# Patient Record
Sex: Female | Born: 1985
Health system: Southern US, Community
[De-identification: ages and names within clinical notes are randomized; demographics above are authoritative.]

## PROBLEM LIST (undated history)

## (undated) DIAGNOSIS — F32A Depression, unspecified: Secondary | ICD-10-CM

## (undated) DIAGNOSIS — F419 Anxiety disorder, unspecified: Secondary | ICD-10-CM

## (undated) DIAGNOSIS — K219 Gastro-esophageal reflux disease without esophagitis: Secondary | ICD-10-CM

## (undated) DIAGNOSIS — F329 Major depressive disorder, single episode, unspecified: Secondary | ICD-10-CM

## (undated) HISTORY — DX: Major depressive disorder, single episode, unspecified: F32.9

## (undated) HISTORY — DX: Anxiety disorder, unspecified: F41.9

## (undated) HISTORY — DX: Depression, unspecified: F32.A

## (undated) HISTORY — DX: Gastro-esophageal reflux disease without esophagitis: K21.9

---
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
## 2007-04-09 ENCOUNTER — Emergency Department (HOSPITAL_COMMUNITY): Admission: EM | Admit: 2007-04-09 | Discharge: 2007-04-09 | Payer: Self-pay | Admitting: Emergency Medicine

## 2007-04-10 ENCOUNTER — Emergency Department (HOSPITAL_COMMUNITY): Admission: EM | Admit: 2007-04-10 | Discharge: 2007-04-10 | Payer: Self-pay | Admitting: *Deleted

## 2007-07-29 ENCOUNTER — Emergency Department (HOSPITAL_COMMUNITY): Admission: EM | Admit: 2007-07-29 | Discharge: 2007-07-29 | Payer: Self-pay | Admitting: Emergency Medicine

## 2007-08-16 ENCOUNTER — Emergency Department (HOSPITAL_COMMUNITY): Admission: EM | Admit: 2007-08-16 | Discharge: 2007-08-16 | Payer: Self-pay | Admitting: Emergency Medicine

## 2010-07-12 ENCOUNTER — Ambulatory Visit (HOSPITAL_COMMUNITY)
Admission: RE | Admit: 2010-07-12 | Discharge: 2010-07-12 | Disposition: A | Discharge status: Home / Self Care | Payer: Worker's Compensation | Source: Ambulatory Visit | Attending: Specialist | Admitting: Specialist

## 2010-07-12 ENCOUNTER — Other Ambulatory Visit (HOSPITAL_COMMUNITY): Payer: Self-pay | Admitting: Specialist

## 2010-07-12 ENCOUNTER — Ambulatory Visit (HOSPITAL_COMMUNITY): Payer: Worker's Compensation | Attending: *Deleted

## 2010-07-12 DIAGNOSIS — M5126 Other intervertebral disc displacement, lumbar region: Secondary | ICD-10-CM | POA: Insufficient documentation

## 2010-07-12 DIAGNOSIS — Z01818 Encounter for other preprocedural examination: Secondary | ICD-10-CM | POA: Insufficient documentation

## 2010-07-12 LAB — URINALYSIS, ROUTINE W REFLEX MICROSCOPIC
Bilirubin Urine: NEGATIVE
Ketones, ur: NEGATIVE mg/dL
Protein, ur: NEGATIVE mg/dL
Urine Glucose, Fasting: NEGATIVE mg/dL
pH: 6 (ref 5.0–8.0)

## 2010-07-12 LAB — COMPREHENSIVE METABOLIC PANEL
Albumin: 3.9 g/dL (ref 3.5–5.2)
BUN: 6 mg/dL (ref 6–23)
CO2: 23 mEq/L (ref 19–32)
Calcium: 9.4 mg/dL (ref 8.4–10.5)
Chloride: 106 mEq/L (ref 96–112)
Creatinine, Ser: 0.81 mg/dL (ref 0.4–1.2)
GFR calc non Af Amer: >60 mL/min (ref >60)
Total Bilirubin: 0.6 mg/dL (ref 0.3–1.2)

## 2010-07-12 LAB — CBC
Hemoglobin: 14.1 g/dL (ref 12.0–15.0)
MCHC: 35.3 g/dL (ref 30.0–36.0)
RBC: 4.32 MIL/uL (ref 3.87–5.11)
WBC: 10.1 10*3/uL (ref 4.0–10.5)

## 2010-07-12 LAB — URINE MICROSCOPIC-ADD ON

## 2010-07-12 LAB — APTT: aPTT: 29 seconds (ref 24–37)

## 2010-07-12 LAB — SURGICAL PCR SCREEN
MRSA, PCR: NEGATIVE
Staphylococcus aureus: NEGATIVE

## 2010-07-12 LAB — PROTIME-INR: Prothrombin Time: 13.5 seconds (ref 11.6–15.2)

## 2010-07-17 ENCOUNTER — Ambulatory Visit (HOSPITAL_COMMUNITY): Payer: Worker's Compensation

## 2010-07-17 ENCOUNTER — Ambulatory Visit (HOSPITAL_COMMUNITY)
Admission: RE | Admit: 2010-07-17 | Discharge: 2010-07-18 | Disposition: A | Discharge status: Home / Self Care | Payer: Worker's Compensation | Attending: Specialist | Admitting: Specialist

## 2010-07-17 DIAGNOSIS — M5126 Other intervertebral disc displacement, lumbar region: Secondary | ICD-10-CM | POA: Insufficient documentation

## 2010-07-17 DIAGNOSIS — IMO0002 Reserved for concepts with insufficient information to code with codable children: Secondary | ICD-10-CM | POA: Insufficient documentation

## 2010-07-17 DIAGNOSIS — F17200 Nicotine dependence, unspecified, uncomplicated: Secondary | ICD-10-CM | POA: Insufficient documentation

## 2010-07-23 NOTE — Op Note (Signed)
Mary Nielsen, Mary Nielsen                ACCOUNT NO.:  [id number]  MEDICAL RECORD NO.:  [id number]           PATIENT TYPE:  O  LOCATION:  XRAY                         FACILITY:  [facility]  PHYSICIAN:  Jene Every, M.D.    DATE OF BIRTH:  June 03, 1986  DATE OF PROCEDURE:  07/17/2010 DATE OF DISCHARGE:  07/12/2010                              OPERATIVE REPORT   PREOPERATIVE DIAGNOSES: 1. Spinal stenosis. 2. Herniated nucleus pulposus L5-S1, left.  POSTOPERATIVE DIAGNOSES: 1. Spinal stenosis. 2. Herniated nucleus pulposus L5-S1, left.  PROCEDURES PERFORMED: 1. Lateral recess decompression L5-S1. 2. Microdiskectomy L5-S1, left. 3. Foraminotomy of S1, left.  ANESTHESIA:  General.  ASSISTANT:  Roma Schanz, P.A.  BRIEF HISTORY:  A 25 year old with left lower extremity radicular pain, positive neural tension sign, diminished sensation in the L5-S1 dermatome, EHL weakness and plantar flexion weakness secondary to disk herniation sustained at work with compression of the S1 nerve root, disk generation, disk space narrowing.  She is indicated for decompression of the S1 nerve root.  Risks and benefits were discussed including bleeding, infection, damage to neurovascular structures, CSF leakage, epidural fibrosis, adjacent segment disease, need for fusion in the future, DVT, PE, and anesthetic complications, etc.  Technical difficulty increased due to the patient's obesity.  Her BMI was approximately 32.  TECHNIQUE:  The patient in supine position after induction of adequate anesthesia and 2 g Kefzol.  She was placed prone on the [place] frame. All bony prominences were well padded.  Lumbar region was prepped and draped in usual sterile fashion.  An 18-gauge spinal needle was utilized to localize the L5-S1 interspace, confirmed with x-ray.  Incision was made from the spinous process to L5-S1.  Subcutaneous tissue was dissected.  Electrocautery utilized to achieve hemostasis.   Dorsolumbar fascia identified, divided in line with skin incision.  Paraspinous muscle elevated from lamina of L5 and S1.  McCullough retractor was placed.  She had significant subcutaneous adipose tissue.  Operating microscope was draped and brought in the surgical field after we obtained an x-ray confirming the space of L5-S1.  The straight curette was utilized to detach ligamentum flavum from the cephalad edge of S1. Hemilaminotomy of the caudad edge of 5 was performed with 2-mm Kerrison preserving the pars, ligamentum flavum removed from the interspace. Noted was compression of the S1 nerve root into lateral recess due to a focal HNP at L5-S1.  After foraminotomy of S1 was performed, we gently mobilized the S1 nerve root medially, identified the disk herniation and performed annulotomy, removed the disk herniation with a micro pituitary and a straight pituitary.  Multiple free fragments were removed. Following this, we entered the disk space with straight and upbiting pituitary, performed a thorough diskectomy of all herniated material. Degenerative disk material was appreciated.  There were also osteophytes over the posterior aspect of the vertebral body of L5 and S1.  Following the full diskectomy, we checked beneath the thecal sac, the axilla of the root, the foramen of L5 and S1.  No residual disk herniation compressing the L5 or the S1 nerve root.  There was at least 1 cm of  excursion on the S1 nerve root medial to the pedicle without difficulty. Disk space copiously irrigated with antibiotic irrigation.  The S1 nerve root was found to be erythematous and edematous, but now without tension.  Bipolar cautery had been utilized to achieve hemostasis.  I copiously irrigated the disk space with antibiotic irrigation after obtaining a confirmatory radiograph.  Next, epidural fat was draped over the S1 nerve root.  Removed the [facility] retractors, copiously irrigated, no evidence of  active bleeding.  Dorsolumbar fascia reapproximated with 1 Vicryl interrupted figure-of-8 sutures, subcutaneous with 2-0 Vicryl simple sutures, and skin was reapproximated with 4-0 subcuticular Prolene.  Wound reinforced with Steri-Strips. Sterile dressing applied.  Placed supine on the hospital bed, extubated without difficulty, and transported to recovery room in satisfactory condition.  The patient tolerated procedure well.  No complications.  BLOOD LOSS:  Minimal.     Jene Every, M.D.     Cordelia Pen  D:  07/17/2010  T:  07/17/2010  Job:  [id number]  Electronically Signed by Jene Every M.D. on 07/23/2010 12:16:58 PM

## 2011-03-03 LAB — CBC
Hemoglobin: 13.5
RBC: 4.34

## 2011-03-03 LAB — WET PREP, GENITAL
Trich, Wet Prep: NONE SEEN
Yeast Wet Prep HPF POC: NONE SEEN

## 2011-03-03 LAB — GC/CHLAMYDIA PROBE AMP, GENITAL: GC Probe Amp, Genital: NEGATIVE

## 2011-03-03 LAB — ABO/RH: ABO/RH(D): A POS

## 2011-03-19 LAB — DIFFERENTIAL
Basophils Absolute: 0.2 — ABNORMAL HIGH
Lymphocytes Relative: 34
Monocytes Absolute: 0.4
Monocytes Relative: 5
Neutro Abs: 3.8

## 2011-03-19 LAB — CBC
HCT: 38.6
MCHC: 34.4
MCV: 91.3
Platelets: 340
RDW: 11.8

## 2011-03-19 LAB — COMPREHENSIVE METABOLIC PANEL
Albumin: 3.5
BUN: 9
Creatinine, Ser: 0.69
Total Bilirubin: 0.4
Total Protein: 6.7

## 2011-06-10 HISTORY — PX: MICRODISCECTOMY LUMBAR: SUR864

## 2011-07-04 ENCOUNTER — Emergency Department: Payer: Self-pay | Admitting: Emergency Medicine

## 2011-09-19 IMAGING — CR DG CHEST 2V
2 series · 2 of 2 positions shown · non-contrast
Comparison: None.

CLINICAL DATA: Preop for microdiskectomy.

CHEST - 2 VIEW

[w chest pa]
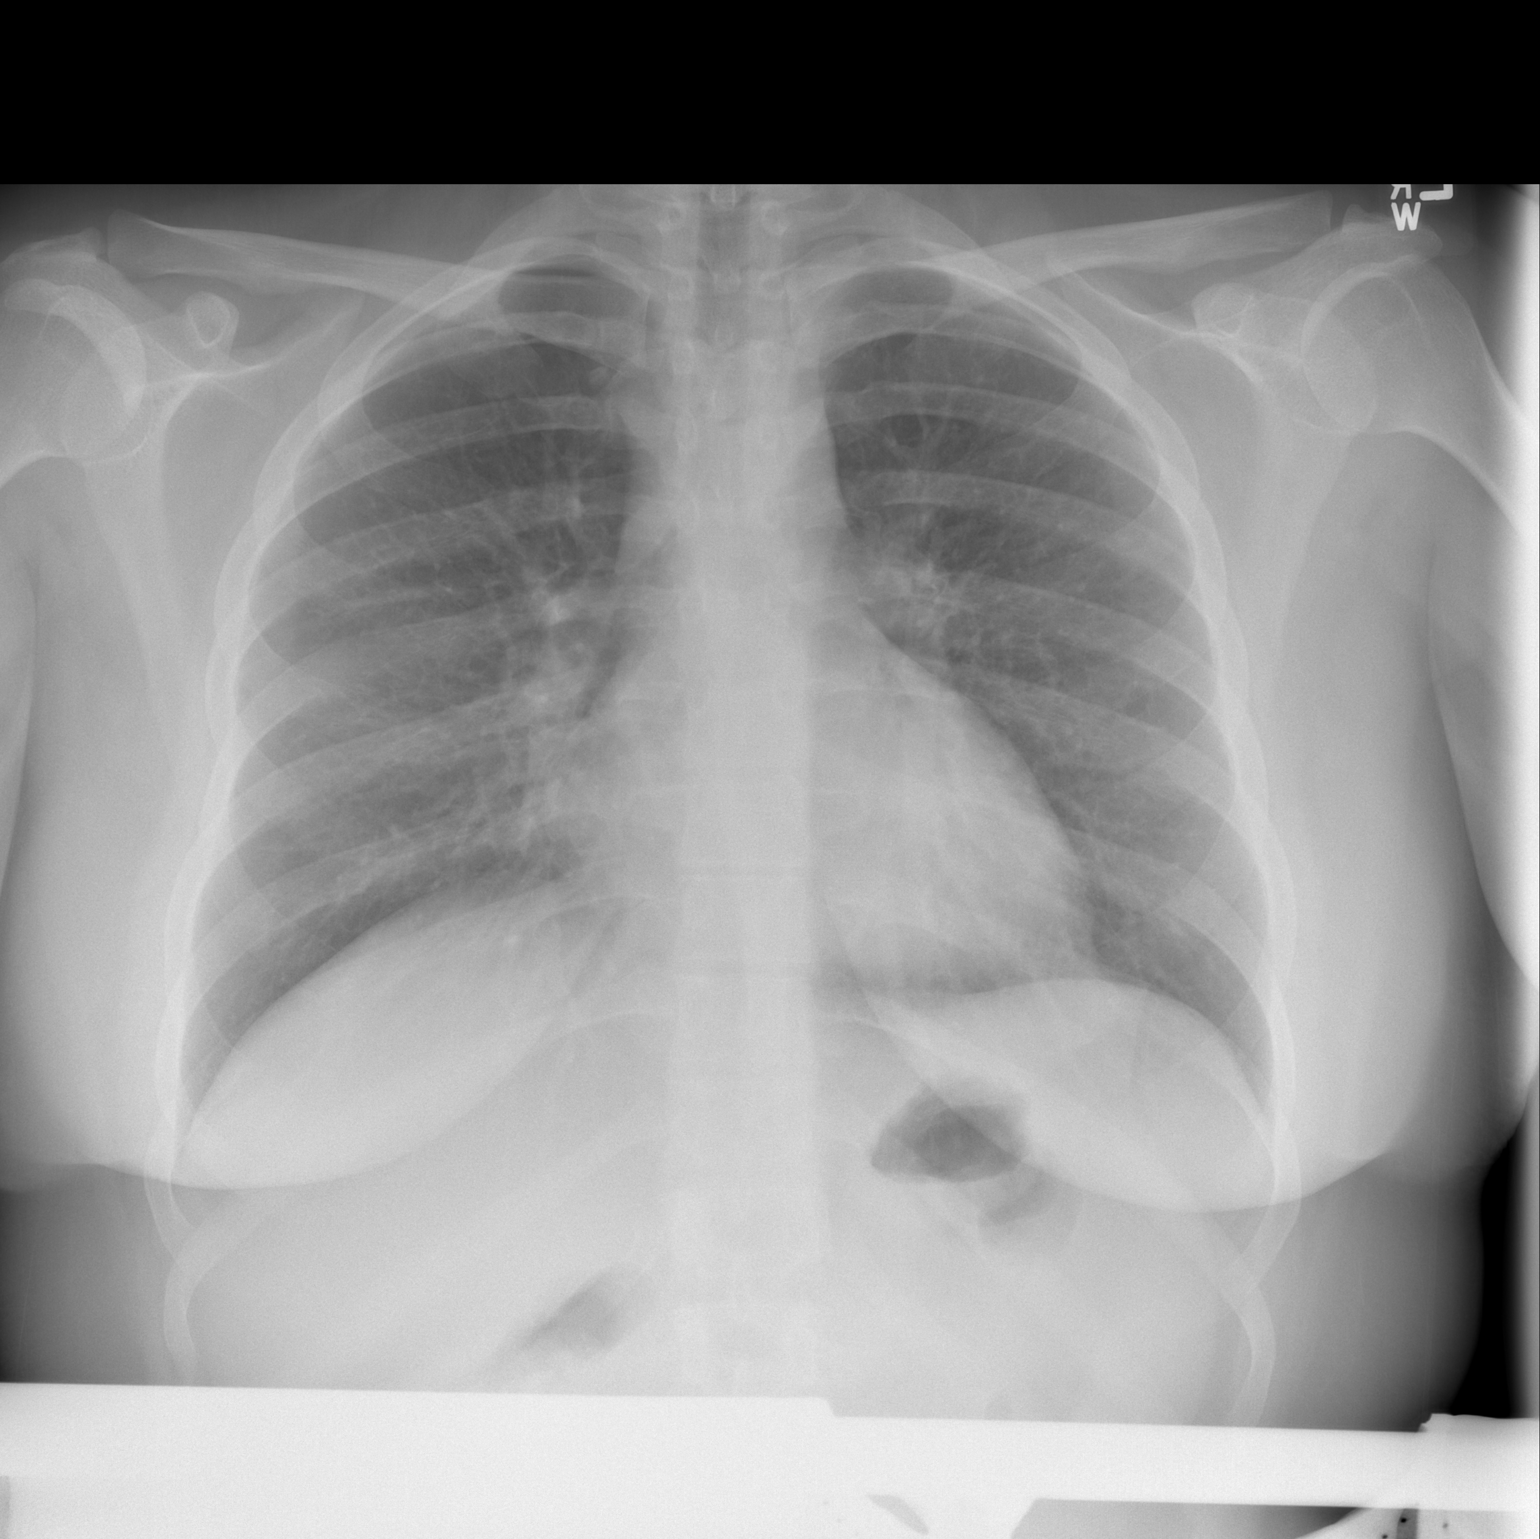

[w chest lat]
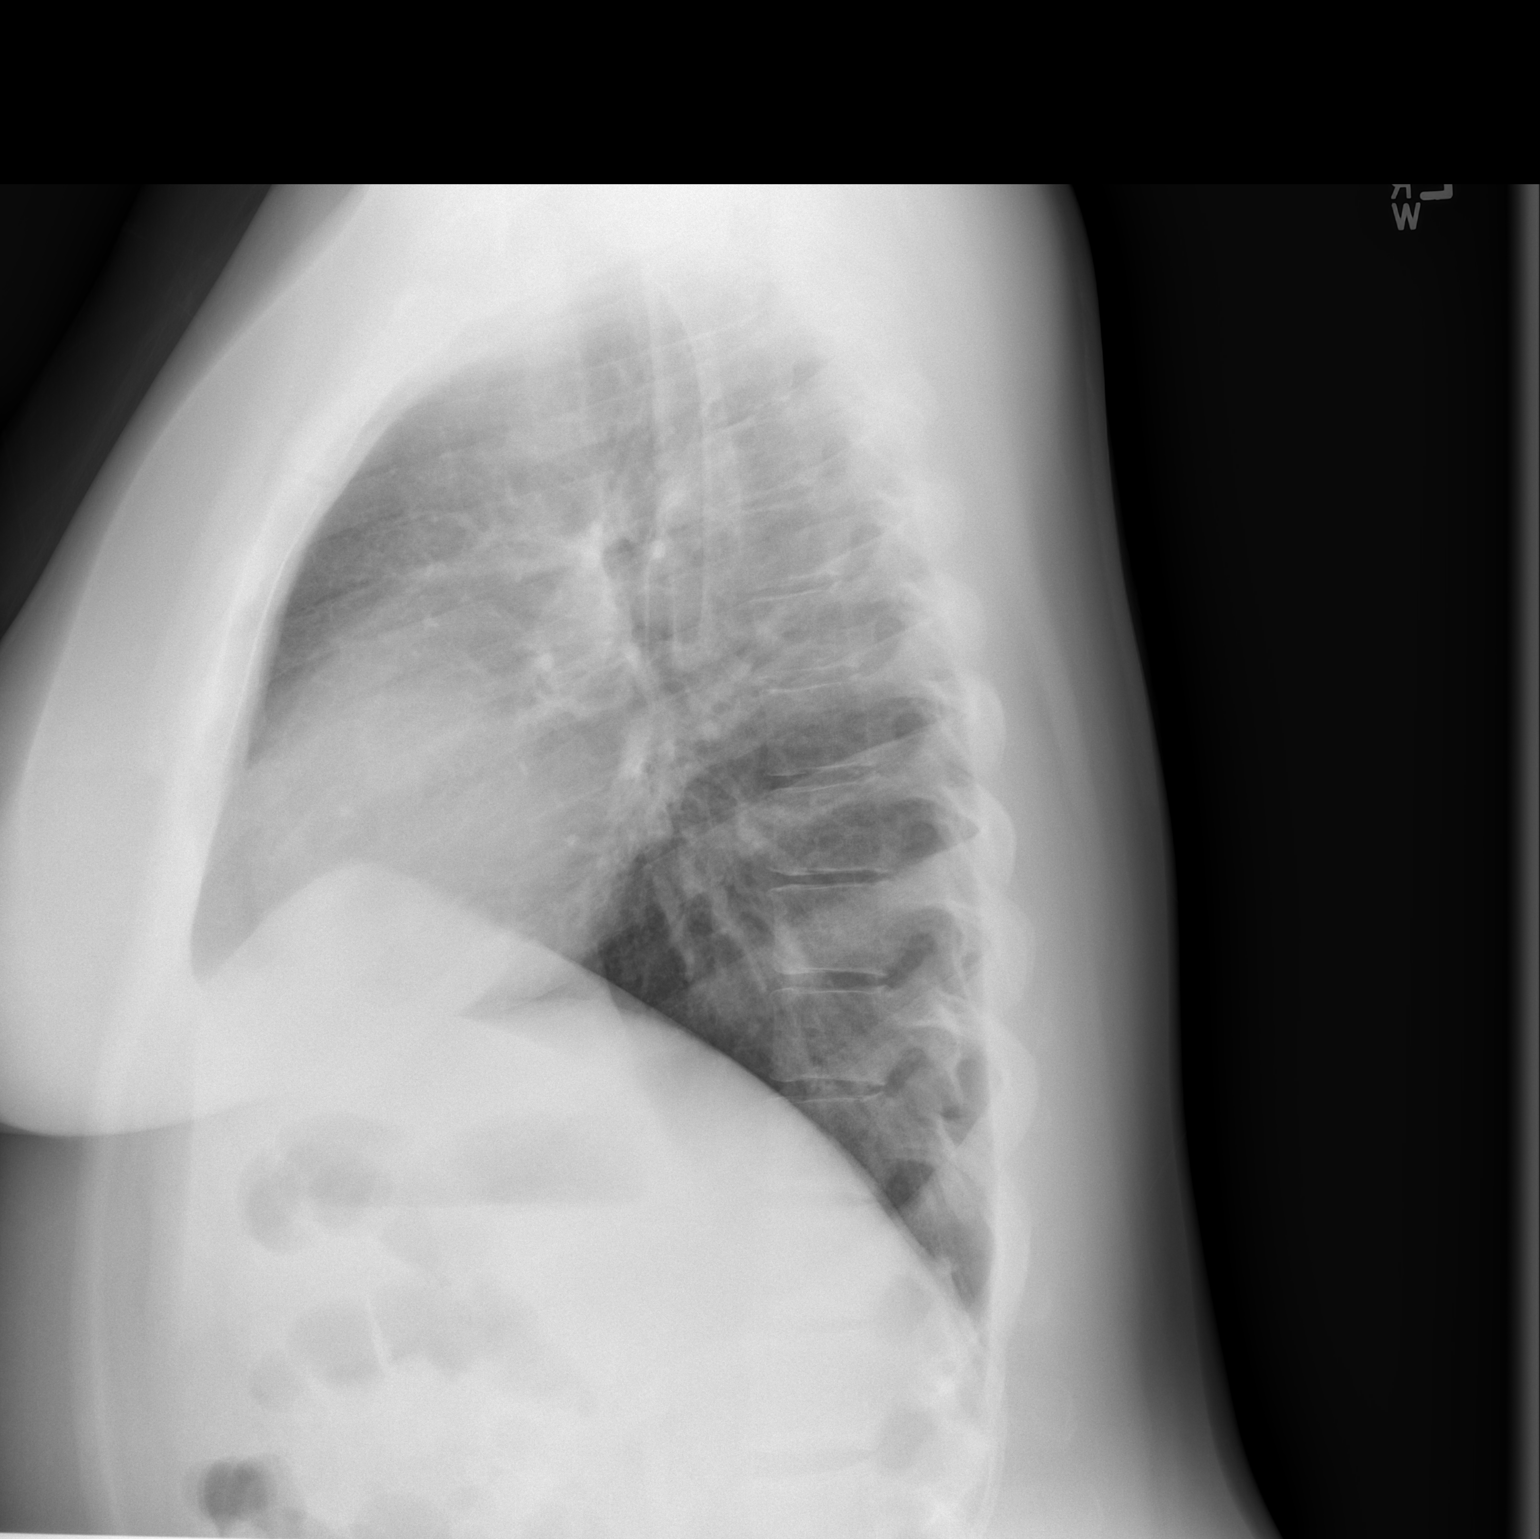

[2 of 2 positions shown; findings below may reference images not displayed]

FINDINGS: The heart size is normal.  The lungs are clear.  The
visualized soft tissues and bony thorax are unremarkable.
IMPRESSION: Negative chest.

## 2011-09-24 IMAGING — CR DG SPINE 1V PORT
1 series · 1 of 1 positions shown · non-contrast
Comparison: Same day and 07/12/2010

CLINICAL DATA: Back pain.  Intraoperative localization.

CERVICAL SPINE - 1 VIEW

[lateral]
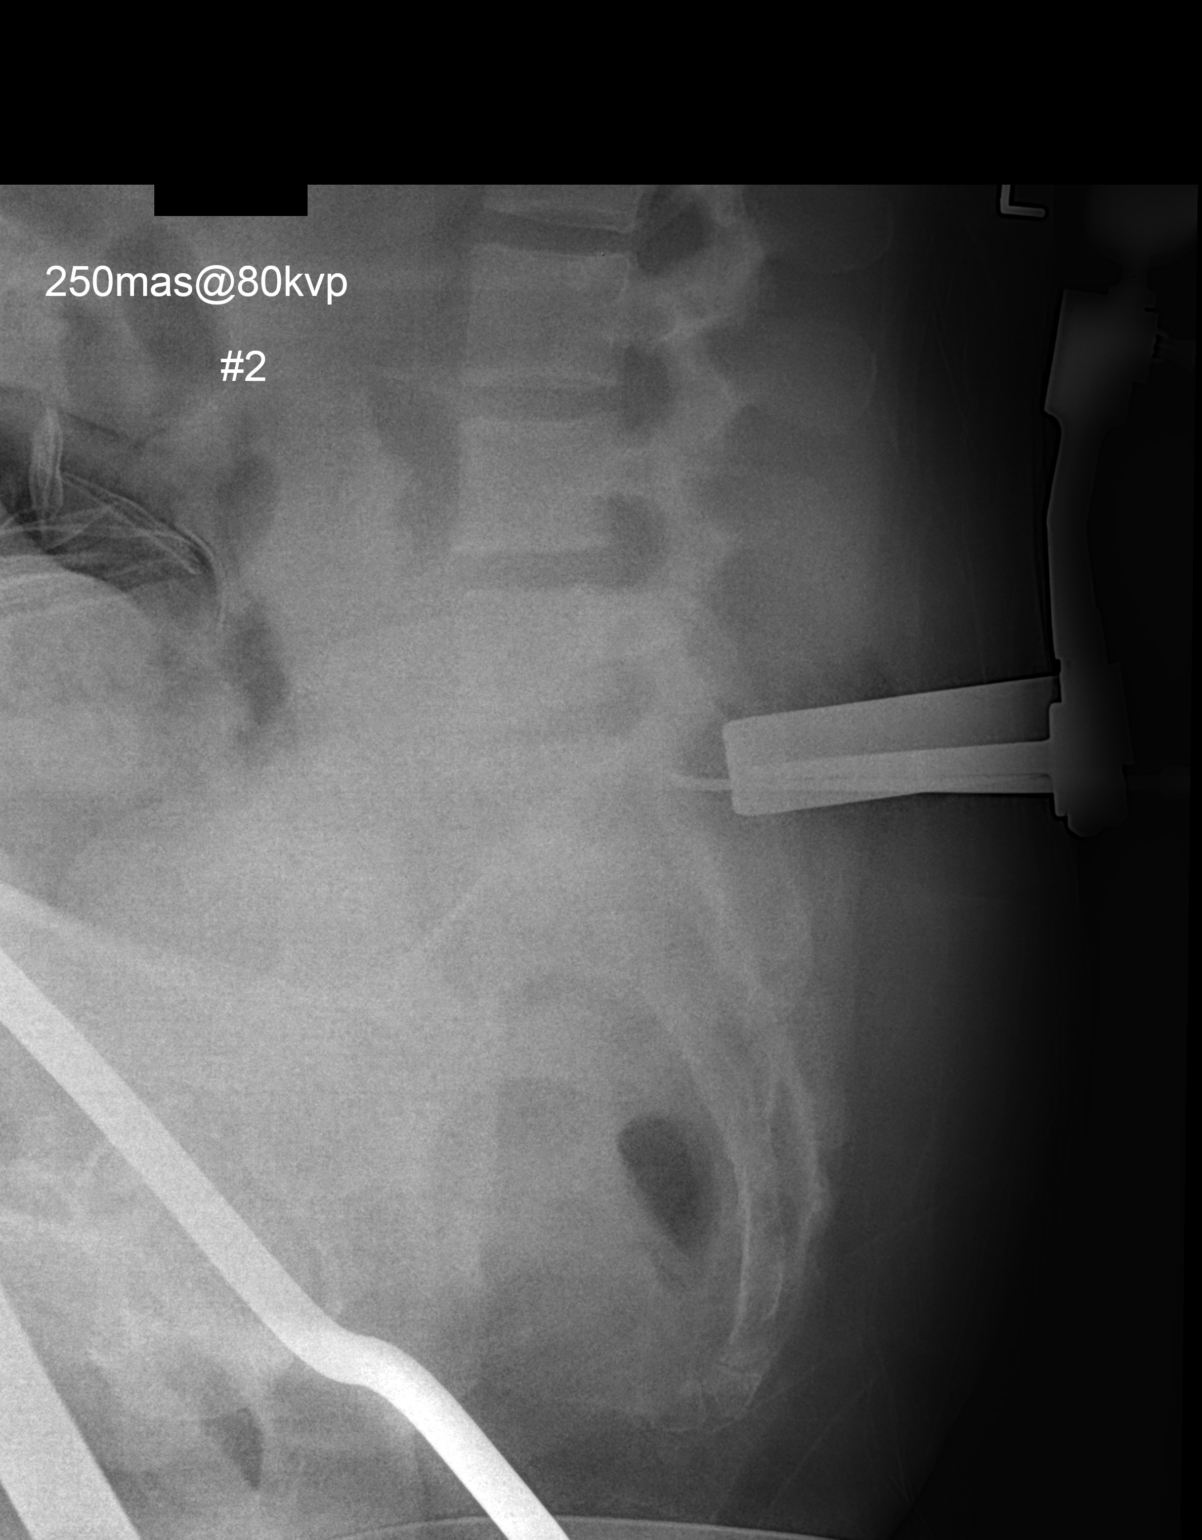

[1 of 1 positions shown; findings below may reference images not displayed]

FINDINGS: Tissue spreaders are in place.  A clamp is present at the
L5 S1 interspinous space, directed at the upper S1 level.
IMPRESSION: L5-S1 localized.

## 2011-09-24 IMAGING — CR DG SPINE 1V PORT
1 series · 1 of 1 positions shown · non-contrast
Comparison: 07/12/2010

CLINICAL DATA: Back pain.

CERVICAL SPINE - 1 VIEW

[lateral]
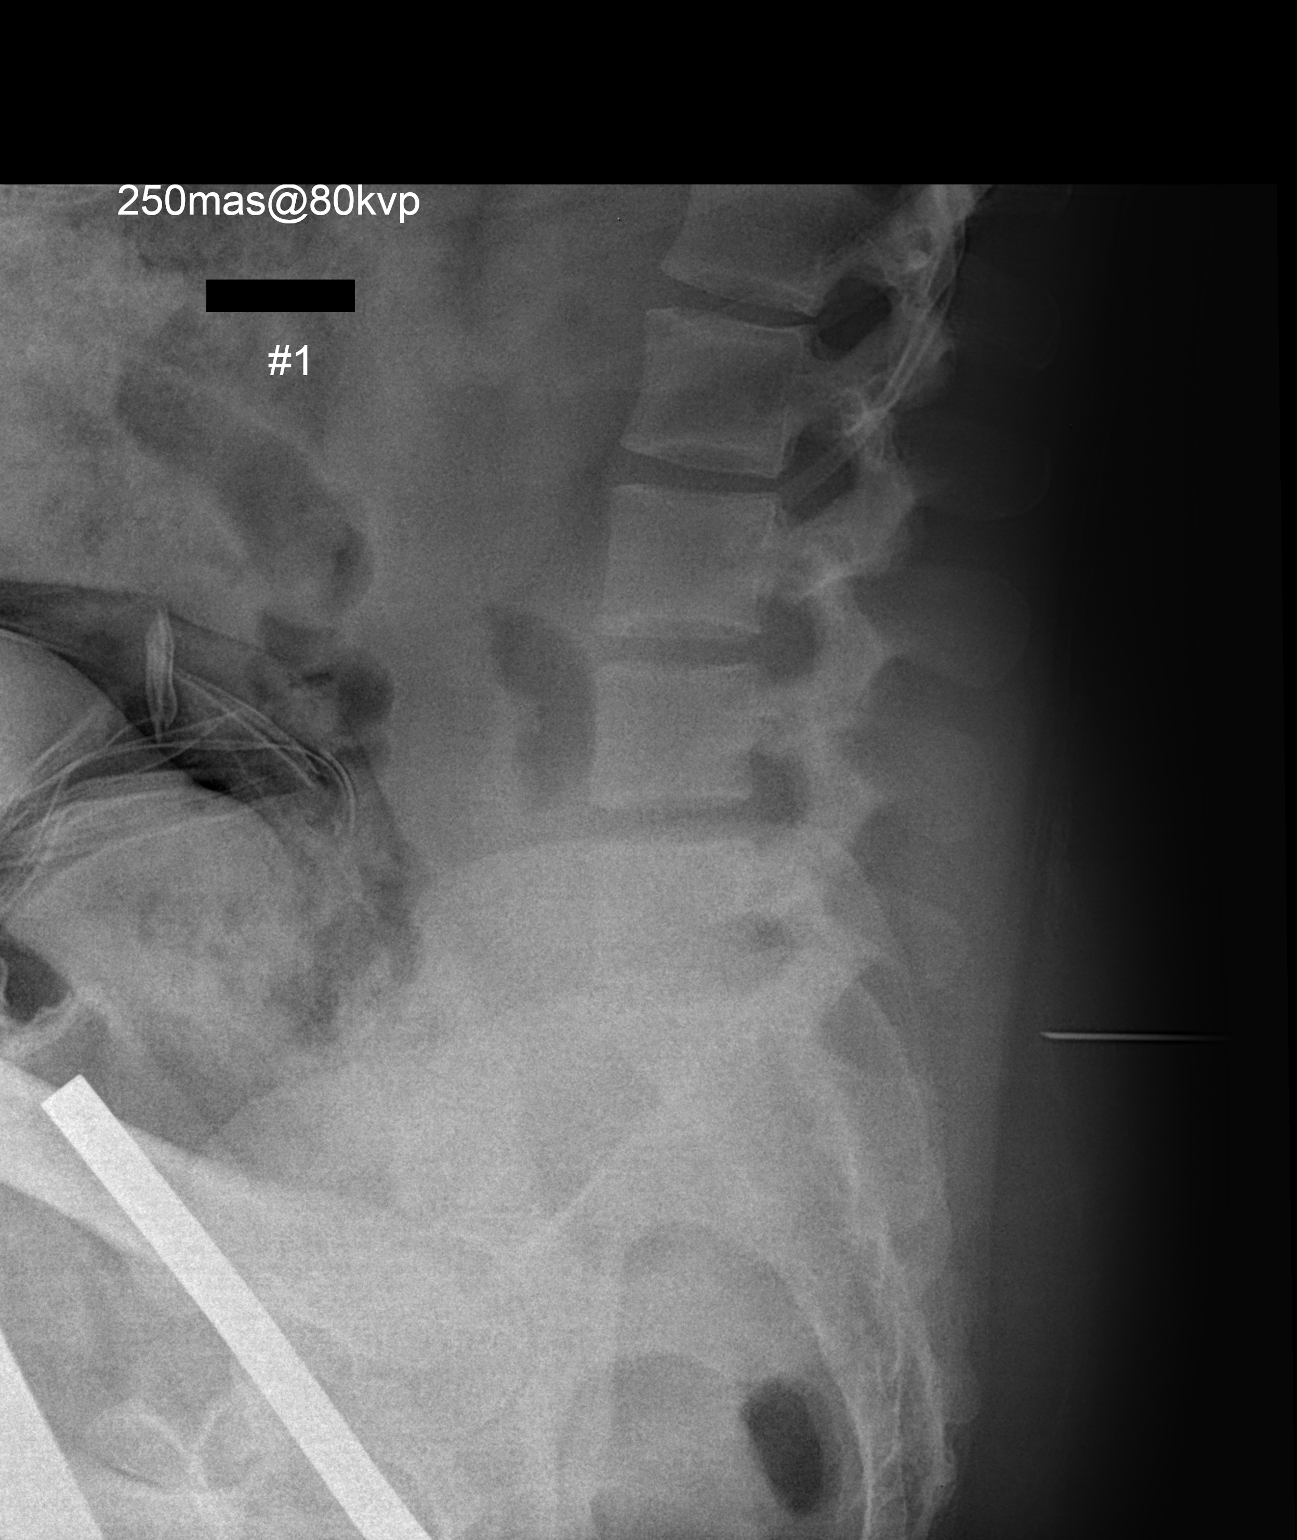

[1 of 1 positions shown; findings below may reference images not displayed]

FINDINGS: A single intraoperative cross-table lateral view of the
lumbar spine taken at 8508 hours is submitted.  Surgical instrument
tip projects in the soft tissues posterior to S1.
IMPRESSION: Intraoperative localization, as above.

## 2012-06-03 ENCOUNTER — Emergency Department: Payer: Self-pay | Admitting: Emergency Medicine

## 2012-06-03 LAB — DIFFERENTIAL
Basophil %: 0.5 %
Eosinophil %: 1.5 %
Lymphocyte %: 12.2 %
Monocyte #: 0.9 x10 3/mm (ref 0.2–0.9)
Monocyte %: 4 %
Neutrophil %: 81.8 %

## 2012-06-03 LAB — COMPREHENSIVE METABOLIC PANEL
Anion Gap: 13 (ref 7–16)
Calcium, Total: 9 mg/dL (ref 8.5–10.1)
Co2: 20 mmol/L — ABNORMAL LOW (ref 21–32)
Creatinine: 0.65 mg/dL (ref 0.60–1.30)
EGFR (African American): >60
EGFR (Non-African Amer.): >60
SGPT (ALT): 35 U/L (ref 12–78)
Sodium: 140 mmol/L (ref 136–145)

## 2012-06-03 LAB — URINALYSIS, COMPLETE
Glucose,UR: NEGATIVE mg/dL (ref 0–75)
Hyaline Cast: 4
Nitrite: NEGATIVE
Ph: 6 (ref 4.5–8.0)
Protein: 30
Specific Gravity: 1.03 (ref 1.003–1.030)
Squamous Epithelial: 25

## 2012-06-03 LAB — LIPASE, BLOOD: Lipase: 135 U/L (ref 73–393)

## 2012-06-03 LAB — CBC
MCH: 32.6 pg (ref 26.0–34.0)
RBC: 4.46 10*6/uL (ref 3.80–5.20)
RDW: 12.3 % (ref 11.5–14.5)

## 2013-01-31 ENCOUNTER — Emergency Department: Payer: Self-pay | Admitting: Internal Medicine

## 2013-04-21 ENCOUNTER — Ambulatory Visit: Payer: Self-pay | Admitting: Gastroenterology

## 2014-09-14 DIAGNOSIS — G4700 Insomnia, unspecified: Secondary | ICD-10-CM | POA: Insufficient documentation

## 2014-09-14 DIAGNOSIS — F5105 Insomnia due to other mental disorder: Secondary | ICD-10-CM

## 2014-09-14 DIAGNOSIS — F419 Anxiety disorder, unspecified: Secondary | ICD-10-CM | POA: Insufficient documentation

## 2014-09-14 DIAGNOSIS — M5440 Lumbago with sciatica, unspecified side: Secondary | ICD-10-CM | POA: Insufficient documentation

## 2014-09-14 DIAGNOSIS — K219 Gastro-esophageal reflux disease without esophagitis: Secondary | ICD-10-CM | POA: Insufficient documentation

## 2014-09-14 DIAGNOSIS — C539 Malignant neoplasm of cervix uteri, unspecified: Secondary | ICD-10-CM | POA: Insufficient documentation

## 2014-09-14 DIAGNOSIS — R7989 Other specified abnormal findings of blood chemistry: Secondary | ICD-10-CM | POA: Insufficient documentation

## 2014-09-14 DIAGNOSIS — Z1331 Encounter for screening for depression: Secondary | ICD-10-CM | POA: Insufficient documentation

## 2014-09-14 DIAGNOSIS — F325 Major depressive disorder, single episode, in full remission: Secondary | ICD-10-CM | POA: Insufficient documentation

## 2014-09-14 DIAGNOSIS — F410 Panic disorder [episodic paroxysmal anxiety] without agoraphobia: Secondary | ICD-10-CM | POA: Insufficient documentation

## 2014-11-17 ENCOUNTER — Telehealth: Payer: Self-pay | Admitting: Family Medicine

## 2014-11-17 NOTE — Telephone Encounter (Signed)
PT IS COMING IN ON June 15

## 2014-11-17 NOTE — Telephone Encounter (Signed)
Can you please try to move patients appt up for a refill?

## 2014-11-17 NOTE — Telephone Encounter (Signed)
PT HAS APPT ON 22 OF June. Pt said that she will be 5 days short on her anxiety meds and that she takes it 2 x a day. PHARM IS [facility] ON [address].

## 2014-11-22 ENCOUNTER — Encounter: Payer: Self-pay | Admitting: Family Medicine

## 2014-11-22 ENCOUNTER — Ambulatory Visit (INDEPENDENT_AMBULATORY_CARE_PROVIDER_SITE_OTHER): Payer: 59 | Admitting: Family Medicine

## 2014-11-22 VITALS — BP 120/80 | HR 98 | Resp 15 | Ht 64.0 in | Wt 247.3 lb

## 2014-11-22 DIAGNOSIS — F410 Panic disorder [episodic paroxysmal anxiety] without agoraphobia: Secondary | ICD-10-CM | POA: Diagnosis not present

## 2014-11-22 DIAGNOSIS — F5105 Insomnia due to other mental disorder: Secondary | ICD-10-CM

## 2014-11-22 DIAGNOSIS — F325 Major depressive disorder, single episode, in full remission: Secondary | ICD-10-CM

## 2014-11-22 DIAGNOSIS — F419 Anxiety disorder, unspecified: Secondary | ICD-10-CM | POA: Diagnosis not present

## 2014-11-22 DIAGNOSIS — F324 Major depressive disorder, single episode, in partial remission: Secondary | ICD-10-CM

## 2014-11-22 MED ORDER — SERTRALINE HCL 100 MG PO TABS: 100.0000 mg | ORAL_TABLET | Freq: Two times a day (BID) | ORAL | Status: DC

## 2014-11-22 MED ORDER — CLONAZEPAM 1 MG PO TABS: 1.0000 mg | ORAL_TABLET | Freq: Two times a day (BID) | ORAL | Status: DC | PRN

## 2014-11-22 MED ORDER — ESZOPICLONE 2 MG PO TABS: 2.0000 mg | ORAL_TABLET | Freq: Every evening | ORAL | Status: DC | PRN

## 2014-11-22 NOTE — Progress Notes (Signed)
Name: Mary Nielsen   MRN: [id number]    DOB: 11-21-1985   Date:11/22/2014       Progress Note  Subjective  Chief Complaint  Chief Complaint  Patient presents with  . Insomnia  . Anxiety  . Depression    Anxiety Presents for follow-up visit. The problem has been unchanged. Symptoms include chest pain, excessive worry, feeling of choking, irritability, nervous/anxious behavior, panic (has been waking up from sleep short of breath, with heart palpitations similar to having a panic attack), restlessness and shortness of breath. Patient reports no depressed mood, dizziness or insomnia. Symptoms occur most days. The patient sleeps 8 hours per night. The quality of sleep is good. Nighttime awakenings: one to two.   Past treatments include benzodiazephines and SSRIs. The treatment provided significant relief. Compliance with prior treatments has been good.      Past Medical History  Diagnosis Date  . Depression   . Anxiety   . GERD (gastroesophageal reflux disease)     Past Surgical History  Procedure Laterality Date  . Microdiscectomy lumbar  2013    Dr. Tonita Cong    Family History  Problem Relation Age of Onset  . Cancer Father     Mouth Cancer  . Heart disease Maternal Grandmother   . Diabetes Maternal Grandmother   . Cancer Paternal Grandmother     Breast Cancer    History   Social History  . Marital Status: Single    Spouse Name: N/A  . Number of Children: N/A  . Years of Education: N/A   Occupational History  . Not on file.   Social History Main Topics  . Smoking status: Former Research scientist (life sciences)  . Smokeless tobacco: Never Used  . Alcohol Use: No  . Drug Use: No  . Sexual Activity: Not on file   Other Topics Concern  . Not on file   Social History Narrative     Current outpatient prescriptions:  .  clonazePAM (KLONOPIN) 1 MG tablet, Take 1 mg by mouth 2 (two) times daily. , Disp: , Rfl:  .  eszopiclone (LUNESTA) 2 MG TABS tablet, Take 2 mg by mouth at bedtime as  needed. , Disp: , Rfl:  .  sertraline (ZOLOFT) 100 MG tablet, Take 100 mg by mouth 2 (two) times daily. , Disp: , Rfl:   Allergies  Allergen Reactions  . Codeine Itching     Review of Systems  Constitutional: Positive for irritability.  Respiratory: Positive for shortness of breath.   Cardiovascular: Positive for chest pain.  Neurological: Negative for dizziness and seizures.  Psychiatric/Behavioral: Positive for depression. Negative for substance abuse. The patient is nervous/anxious. The patient does not have insomnia.       Objective  Filed Vitals:   11/22/14 1504  BP: 120/80  Pulse: 98  Resp: 15  Height: 5\' 4"  (1.626 m)  Weight: 247 lb 4.8 oz (112.175 kg)  SpO2: 98%    Physical Exam  Constitutional: She is oriented to person, place, and time and well-developed, well-nourished, and in no distress.  HENT:  Head: Normocephalic and atraumatic.  Cardiovascular: Normal rate and regular rhythm.   Pulmonary/Chest: Effort normal and breath sounds normal.  Neurological: She is alert and oriented to person, place, and time.  Psychiatric: Mood, memory, affect and judgment normal.  Nursing note and vitals reviewed.      No results found for this or any previous visit (from the past 2160 hour(s)).   Assessment & Plan 1. Panic disorder without agoraphobia  Patient will split the evening dose of clonazepam 1 mg between early evening and bedtime doses. She has been educated in detail about the potential interaction between clonazepam and Lunesta and has been asked to avoid taking the 2 together. - clonazePAM (KLONOPIN) 1 MG tablet; Take 1 tablet (1 mg total) by mouth 2 (two) times daily as needed for anxiety (Take 0.5mg  in evening and 0.5mg  at bedtime).  Dispense: 60 tablet; Refill: 2  2. Insomnia secondary to anxiety Symptoms responsive to therapy. Continue current management - eszopiclone (LUNESTA) 2 MG TABS tablet; Take 1 tablet (2 mg total) by mouth at bedtime as needed.   Dispense: 30 tablet; Refill: 2  3. Depression, major, in remission  - sertraline (ZOLOFT) 100 MG tablet; Take 1 tablet (100 mg total) by mouth 2 (two) times daily.  Dispense: 60 tablet; Refill: 2   There are no diagnoses linked to this encounter.  Iyesha Such Asad A[facility] 11/22/2014 3:14 PM

## 2014-11-29 ENCOUNTER — Ambulatory Visit: Payer: Self-pay | Admitting: Family Medicine

## 2015-02-15 ENCOUNTER — Ambulatory Visit (INDEPENDENT_AMBULATORY_CARE_PROVIDER_SITE_OTHER): Payer: 59 | Admitting: Family Medicine

## 2015-02-15 ENCOUNTER — Encounter: Payer: Self-pay | Admitting: Family Medicine

## 2015-02-15 DIAGNOSIS — F411 Generalized anxiety disorder: Secondary | ICD-10-CM | POA: Diagnosis not present

## 2015-02-15 DIAGNOSIS — F324 Major depressive disorder, single episode, in partial remission: Secondary | ICD-10-CM

## 2015-02-15 DIAGNOSIS — F325 Major depressive disorder, single episode, in full remission: Secondary | ICD-10-CM

## 2015-02-15 DIAGNOSIS — F419 Anxiety disorder, unspecified: Secondary | ICD-10-CM | POA: Diagnosis not present

## 2015-02-15 DIAGNOSIS — F5105 Insomnia due to other mental disorder: Secondary | ICD-10-CM

## 2015-02-15 MED ORDER — ESZOPICLONE 2 MG PO TABS: 2.0000 mg | ORAL_TABLET | Freq: Every evening | ORAL | Status: DC | PRN

## 2015-02-15 MED ORDER — CLONAZEPAM 1 MG PO TABS: 1.0000 mg | ORAL_TABLET | Freq: Two times a day (BID) | ORAL | Status: DC | PRN

## 2015-02-15 MED ORDER — SERTRALINE HCL 100 MG PO TABS: 100.0000 mg | ORAL_TABLET | Freq: Two times a day (BID) | ORAL | Status: DC

## 2015-02-15 NOTE — Progress Notes (Signed)
Name: Mary Nielsen   MRN: [id number]    DOB: [birth year]   Date:02/15/2015       Progress Note  Subjective  Chief Complaint  Chief Complaint  Patient presents with  . Follow-up    3 mo  . Anxiety  . Depression  . Medication Refill    Anxiety Presents for follow-up visit. Symptoms include depressed mood, insomnia, nervous/anxious behavior, palpitations and restlessness.   Past treatments include benzodiazephines. The treatment provided significant relief.  Depression        This is a chronic problem.  Associated symptoms include fatigue, helplessness, hopelessness, insomnia, restlessness and sad.  Past treatments include SSRIs - Selective serotonin reuptake inhibitors.  Compliance with treatment is good.  Past medical history includes anxiety.   Insomnia Primary symptoms: no sleep disturbance, no difficulty falling asleep, no frequent awakening.  Past treatments include medication. PMH includes: depression.   Past Medical History  Diagnosis Date  . Depression   . Anxiety   . GERD (gastroesophageal reflux disease)     Past Surgical History  Procedure Laterality Date  . Microdiscectomy lumbar  2013    Dr. Tonita Cong    Family History  Problem Relation Age of Onset  . Cancer Father     Mouth Cancer  . Heart disease Maternal Grandmother   . Diabetes Maternal Grandmother   . Cancer Paternal Grandmother     Breast Cancer    Social History   Social History  . Marital Status: Single    Spouse Name: N/A  . Number of Children: N/A  . Years of Education: N/A   Occupational History  . Not on file.   Social History Main Topics  . Smoking status: Former Research scientist (life sciences)  . Smokeless tobacco: Never Used  . Alcohol Use: No  . Drug Use: No  . Sexual Activity: Not on file   Other Topics Concern  . Not on file   Social History Narrative     Current outpatient prescriptions:  .  clonazePAM (KLONOPIN) 1 MG tablet, Take 1 tablet (1 mg total) by mouth 2 (two) times daily as needed  for anxiety (Take 0.5mg  in evening and 0.5mg  at bedtime)., Disp: 60 tablet, Rfl: 2 .  eszopiclone (LUNESTA) 2 MG TABS tablet, Take 1 tablet (2 mg total) by mouth at bedtime as needed., Disp: 30 tablet, Rfl: 2 .  sertraline (ZOLOFT) 100 MG tablet, Take 1 tablet (100 mg total) by mouth 2 (two) times daily., Disp: 60 tablet, Rfl: 2  Allergies  Allergen Reactions  . Codeine Itching     Review of Systems  Constitutional: Positive for fatigue.  Cardiovascular: Positive for palpitations.  Psychiatric/Behavioral: Positive for depression. Negative for sleep disturbance. The patient is nervous/anxious and has insomnia.     Objective  Filed Vitals:   02/15/15 1523  BP: 120/78  Pulse: 98  Temp: 98.7 F (37.1 C)  TempSrc: Oral  Resp: 18  Height: 5\' 4"  (1.626 m)  Weight: 243 lb 8 oz (110.451 kg)  SpO2: 95%    Physical Exam  Constitutional: She is oriented to person, place, and time and well-developed, well-nourished, and in no distress.  Cardiovascular: Normal rate and regular rhythm.   Pulmonary/Chest: Effort normal and breath sounds normal.  Neurological: She is alert and oriented to person, place, and time.  Psychiatric: Mood, memory, affect and judgment normal.  Nursing note and vitals reviewed.  Assessment & Plan  1. Generalized anxiety disorder Stable on present benzodiazepine therapy. Patient is aware of the dependence  potential and drug interactions of benzodiazepines. Refills provided and follow-up in 3 months. - clonazePAM (KLONOPIN) 1 MG tablet; Take 1 tablet (1 mg total) by mouth 2 (two) times daily as needed for anxiety (Take 0.5mg  in evening and 0.5mg  at bedtime).  Dispense: 60 tablet; Refill: 2  2. Depression, major, in remission Symptoms are stable and controlled on present SSRI therapy. Refills provided and follow-up in 3 months. - sertraline (ZOLOFT) 100 MG tablet; Take 1 tablet (100 mg total) by mouth 2 (two) times daily.  Dispense: 60 tablet; Refill: 2  3.  Insomnia secondary to anxiety  - eszopiclone (LUNESTA) 2 MG TABS tablet; Take 1 tablet (2 mg total) by mouth at bedtime as needed.  Dispense: 30 tablet; Refill: 2   Betina Puckett Asad A[facility] 02/15/2015 3:29 PM

## 2015-02-22 ENCOUNTER — Ambulatory Visit: Payer: 59 | Admitting: Family Medicine

## 2015-05-07 ENCOUNTER — Other Ambulatory Visit: Payer: Self-pay | Admitting: Family Medicine

## 2015-05-07 DIAGNOSIS — F411 Generalized anxiety disorder: Secondary | ICD-10-CM

## 2015-05-07 DIAGNOSIS — F419 Anxiety disorder, unspecified: Secondary | ICD-10-CM

## 2015-05-07 DIAGNOSIS — F5105 Insomnia due to other mental disorder: Secondary | ICD-10-CM

## 2015-05-07 DIAGNOSIS — F325 Major depressive disorder, single episode, in full remission: Secondary | ICD-10-CM

## 2015-05-07 NOTE — Telephone Encounter (Signed)
Pt needs refill on Clonazepam. Sertraline and Eszoticlone to be called into [place].

## 2015-05-08 NOTE — Telephone Encounter (Signed)
Routed to Dr. Shah for approval 

## 2015-05-09 ENCOUNTER — Other Ambulatory Visit: Payer: Self-pay

## 2015-05-09 ENCOUNTER — Telehealth: Payer: Self-pay | Admitting: Family Medicine

## 2015-05-09 DIAGNOSIS — F5105 Insomnia due to other mental disorder: Secondary | ICD-10-CM

## 2015-05-09 DIAGNOSIS — F411 Generalized anxiety disorder: Secondary | ICD-10-CM

## 2015-05-09 DIAGNOSIS — F419 Anxiety disorder, unspecified: Secondary | ICD-10-CM

## 2015-05-09 DIAGNOSIS — F325 Major depressive disorder, single episode, in full remission: Secondary | ICD-10-CM

## 2015-05-09 MED ORDER — SERTRALINE HCL 100 MG PO TABS: 100.0000 mg | ORAL_TABLET | Freq: Two times a day (BID) | ORAL | Status: DC

## 2015-05-09 MED ORDER — CLONAZEPAM 1 MG PO TABS: 1.0000 mg | ORAL_TABLET | Freq: Two times a day (BID) | ORAL | Status: DC | PRN

## 2015-05-09 MED ORDER — ESZOPICLONE 2 MG PO TABS: 2.0000 mg | ORAL_TABLET | Freq: Every evening | ORAL | Status: DC | PRN

## 2015-05-09 NOTE — Telephone Encounter (Signed)
Please she last patient message from 05-07-15: Patient was informed that you would not refill her medication that you indicated that she need to schedule an appointment, however, patient stated that on last visit you wanted her to return in 6 months. Her last visit was in September. Please advise because she was not able to take her medication for today.

## 2015-05-09 NOTE — Telephone Encounter (Signed)
A one week supply of medication has been refilled and ready for patient pickup per Dr. Manuella Ghazi and patient has been notified and needs to bring a photo ID, patient verbalized understanding

## 2015-05-09 NOTE — Telephone Encounter (Signed)
Patient will schedule an appointment for one week and her prescriptions have been sent to the pharmacy for 10 days.

## 2015-05-14 ENCOUNTER — Ambulatory Visit (INDEPENDENT_AMBULATORY_CARE_PROVIDER_SITE_OTHER): Payer: 59 | Admitting: Family Medicine

## 2015-05-14 ENCOUNTER — Encounter: Payer: Self-pay | Admitting: Family Medicine

## 2015-05-14 VITALS — BP 120/82 | HR 91 | Temp 98.8°F | Resp 14 | Ht 64.0 in | Wt 243.0 lb

## 2015-05-14 DIAGNOSIS — F410 Panic disorder [episodic paroxysmal anxiety] without agoraphobia: Secondary | ICD-10-CM

## 2015-05-14 DIAGNOSIS — F325 Major depressive disorder, single episode, in full remission: Secondary | ICD-10-CM | POA: Diagnosis not present

## 2015-05-14 DIAGNOSIS — G4700 Insomnia, unspecified: Secondary | ICD-10-CM

## 2015-05-14 DIAGNOSIS — F324 Major depressive disorder, single episode, in partial remission: Secondary | ICD-10-CM | POA: Insufficient documentation

## 2015-05-14 MED ORDER — ESZOPICLONE 2 MG PO TABS: 2.0000 mg | ORAL_TABLET | Freq: Every evening | ORAL | Status: DC | PRN

## 2015-05-14 MED ORDER — SERTRALINE HCL 100 MG PO TABS: 100.0000 mg | ORAL_TABLET | Freq: Two times a day (BID) | ORAL | Status: DC

## 2015-05-14 MED ORDER — CLONAZEPAM 1 MG PO TABS: 1.0000 mg | ORAL_TABLET | Freq: Two times a day (BID) | ORAL | Status: DC | PRN

## 2015-05-14 NOTE — Progress Notes (Signed)
Name: Mary Nielsen   MRN: [id number]    DOB: 1986-04-28   Date:05/14/2015       Progress Note  Subjective  Chief Complaint  Chief Complaint  Patient presents with  . Medication Refill    follow-up  . Depression  . Insomnia  . Joint Pain    ongoing taking otc magnesium wants to know if this is ok    Depression        This is a chronic problem.The problem is unchanged.  Associated symptoms include helplessness, hopelessness, insomnia and sad.  Past treatments include SSRIs - Selective serotonin reuptake inhibitors.  Compliance with treatment is good.  Past medical history includes anxiety.   Insomnia Primary symptoms: no fragmented sleep, no sleep disturbance, no frequent awakening, no malaise/fatigue.  The problem is unchanged. Past treatments include medication. Typical bedtime:  10-11 P.M..  PMH includes: depression.  Anxiety Presents for follow-up visit. The problem has been unchanged. Symptoms include chest pain, depressed mood, excessive worry, insomnia, muscle tension, nervous/anxious behavior, panic and shortness of breath. Patient reports no dizziness. The severity of symptoms is moderate.   Past treatments include benzodiazephines. The treatment provided significant relief. Compliance with prior treatments has been good.    Past Medical History  Diagnosis Date  . Depression   . Anxiety   . GERD (gastroesophageal reflux disease)     Past Surgical History  Procedure Laterality Date  . Microdiscectomy lumbar  2013    Dr. Tonita Cong    Family History  Problem Relation Age of Onset  . Cancer Father     Mouth Cancer  . Heart disease Maternal Grandmother   . Diabetes Maternal Grandmother   . Cancer Paternal Grandmother     Breast Cancer    Social History   Social History  . Marital Status: Single    Spouse Name: N/A  . Number of Children: N/A  . Years of Education: N/A   Occupational History  . Not on file.   Social History Main Topics  . Smoking status:  Former Research scientist (life sciences)  . Smokeless tobacco: Never Used  . Alcohol Use: No  . Drug Use: No  . Sexual Activity: Not on file   Other Topics Concern  . Not on file   Social History Narrative     Current outpatient prescriptions:  .  clonazePAM (KLONOPIN) 1 MG tablet, Take 1 tablet (1 mg total) by mouth 2 (two) times daily as needed for anxiety (Take 0.5mg  in evening and 0.5mg  at bedtime)., Disp: 14 tablet, Rfl: 0 .  eszopiclone (LUNESTA) 2 MG TABS tablet, Take 1 tablet (2 mg total) by mouth at bedtime as needed., Disp: 7 tablet, Rfl: 0 .  sertraline (ZOLOFT) 100 MG tablet, Take 1 tablet (100 mg total) by mouth 2 (two) times daily., Disp: 14 tablet, Rfl: 0  Allergies  Allergen Reactions  . Codeine Itching     Review of Systems  Constitutional: Negative for malaise/fatigue.  Respiratory: Positive for shortness of breath.   Cardiovascular: Positive for chest pain.  Neurological: Negative for dizziness.  Psychiatric/Behavioral: Positive for depression. Negative for sleep disturbance. The patient is nervous/anxious and has insomnia.    Objective  Filed Vitals:   05/14/15 1442  BP: 120/82  Pulse: 91  Temp: 98.8 F (37.1 C)  TempSrc: Oral  Resp: 14  Height: 5\' 4"  (1.626 m)  Weight: 243 lb (110.224 kg)  SpO2: 98%    Physical Exam  Constitutional: She is oriented to person, place, and time and  well-developed, well-nourished, and in no distress.  HENT:  Head: Normocephalic and atraumatic.  Cardiovascular: Normal rate, regular rhythm and normal heart sounds.   Pulmonary/Chest: Effort normal and breath sounds normal.  Neurological: She is alert and oriented to person, place, and time.  Psychiatric: Mood, memory, affect and judgment normal.  Nursing note and vitals reviewed.   Assessment & Plan  1. Insomnia, persistent  - eszopiclone (LUNESTA) 2 MG TABS tablet; Take 1 tablet (2 mg total) by mouth at bedtime as needed.  Dispense: 30 tablet; Refill: 2  2. Panic disorder without  agoraphobia  - clonazePAM (KLONOPIN) 1 MG tablet; Take 1 tablet (1 mg total) by mouth 2 (two) times daily as needed for anxiety.  Dispense: 60 tablet; Refill: 2  3. Depression, major, in remission ([place])  - sertraline (ZOLOFT) 100 MG tablet; Take 1 tablet (100 mg total) by mouth 2 (two) times daily.  Dispense: 60 tablet; Refill: 2   Heidi Lemay Asad A[facility] 05/14/2015 2:54 PM

## 2015-08-03 ENCOUNTER — Ambulatory Visit (INDEPENDENT_AMBULATORY_CARE_PROVIDER_SITE_OTHER): Payer: 59 | Admitting: Family Medicine

## 2015-08-03 ENCOUNTER — Encounter: Payer: Self-pay | Admitting: Family Medicine

## 2015-08-03 VITALS — BP 130/82 | HR 101 | Temp 98.6°F | Resp 16 | Ht 64.0 in | Wt 251.0 lb

## 2015-08-03 DIAGNOSIS — F410 Panic disorder [episodic paroxysmal anxiety] without agoraphobia: Secondary | ICD-10-CM

## 2015-08-03 DIAGNOSIS — G4700 Insomnia, unspecified: Secondary | ICD-10-CM

## 2015-08-03 DIAGNOSIS — F325 Major depressive disorder, single episode, in full remission: Secondary | ICD-10-CM | POA: Diagnosis not present

## 2015-08-03 MED ORDER — SERTRALINE HCL 100 MG PO TABS: 100.0000 mg | ORAL_TABLET | Freq: Two times a day (BID) | ORAL | Status: DC

## 2015-08-03 MED ORDER — ESZOPICLONE 2 MG PO TABS: 2.0000 mg | ORAL_TABLET | Freq: Every evening | ORAL | Status: DC | PRN

## 2015-08-03 MED ORDER — CLONAZEPAM 1 MG PO TABS: 1.0000 mg | ORAL_TABLET | Freq: Two times a day (BID) | ORAL | Status: DC | PRN

## 2015-08-03 NOTE — Progress Notes (Signed)
Name: Mary Nielsen   MRN: [id number]    DOB: April 15, 1986   Date:08/03/2015       Progress Note  Subjective  Chief Complaint  Chief Complaint  Patient presents with  . Anxiety    medication refills  . Depression    Anxiety Presents for follow-up visit. The problem has been gradually worsening. Symptoms include excessive worry, insomnia, nervous/anxious behavior and panic. The severity of symptoms is moderate and causing significant distress.   Her past medical history is significant for depression. Past treatments include benzodiazephines.  Depression      The patient presents with depression.  This is a chronic problem.  Associated symptoms include insomnia, decreased interest and sad.( Feels 'overly stressed, pressured')  Past treatments include SSRIs - Selective serotonin reuptake inhibitors.  Past medical history includes anxiety and depression.   Insomnia Primary symptoms: difficulty falling asleep.  Past treatments include medication. Typical bedtime:  10-11 P.M..  How long after going to bed to you fall asleep: 15-30 minutes.   PMH includes: depression.     Past Medical History  Diagnosis Date  . Depression   . Anxiety   . GERD (gastroesophageal reflux disease)     Past Surgical History  Procedure Laterality Date  . Microdiscectomy lumbar  2013    Dr. Tonita Cong    Family History  Problem Relation Age of Onset  . Cancer Father     Mouth Cancer  . Heart disease Maternal Grandmother   . Diabetes Maternal Grandmother   . Cancer Paternal Grandmother     Breast Cancer    Social History   Social History  . Marital Status: Single    Spouse Name: N/A  . Number of Children: N/A  . Years of Education: N/A   Occupational History  . Not on file.   Social History Main Topics  . Smoking status: Former Research scientist (life sciences)  . Smokeless tobacco: Never Used  . Alcohol Use: No  . Drug Use: No  . Sexual Activity: Not on file   Other Topics Concern  . Not on file   Social History  Narrative     Current outpatient prescriptions:  .  clonazePAM (KLONOPIN) 1 MG tablet, Take 1 tablet (1 mg total) by mouth 2 (two) times daily as needed for anxiety., Disp: 60 tablet, Rfl: 2 .  eszopiclone (LUNESTA) 2 MG TABS tablet, Take 1 tablet (2 mg total) by mouth at bedtime as needed., Disp: 30 tablet, Rfl: 2 .  sertraline (ZOLOFT) 100 MG tablet, Take 1 tablet (100 mg total) by mouth 2 (two) times daily., Disp: 60 tablet, Rfl: 2  Allergies  Allergen Reactions  . Codeine Itching     Review of Systems  Psychiatric/Behavioral: Positive for depression. The patient is nervous/anxious and has insomnia.   For a complete list of ROS, please refer to history of present illness.  Objective  Filed Vitals:   08/03/15 1133  BP: 130/82  Pulse: 101  Temp: 98.6 F (37 C)  TempSrc: Oral  Resp: 16  Height: 5\' 4"  (1.626 m)  Weight: 251 lb (113.853 kg)  SpO2: 97%    Physical Exam  Constitutional: She is oriented to person, place, and time and well-developed, well-nourished, and in no distress.  HENT:  Head: Normocephalic and atraumatic.  Cardiovascular: Normal rate and regular rhythm.   Pulmonary/Chest: Effort normal and breath sounds normal.  Neurological: She is alert and oriented to person, place, and time.  Psychiatric: Mood, memory, affect and judgment normal.  Nursing note  and vitals reviewed.     Assessment & Plan  1. Depression, major, in remission ([place]) Stable on Zoloft 200 mg daily, no reported adverse effects. Refills provided - sertraline (ZOLOFT) 100 MG tablet; Take 1 tablet (100 mg total) by mouth 2 (two) times daily.  Dispense: 60 tablet; Refill: 2  2. Insomnia, persistent Responsive to Lunesta 2 mg at bedtime. - eszopiclone (LUNESTA) 2 MG TABS tablet; Take 1 tablet (2 mg total) by mouth at bedtime as needed.  Dispense: 30 tablet; Refill: 2  3. 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  Believes clonazepam is  helping relieve her anxiety. Refills provided. - clonazePAM (KLONOPIN) 1 MG tablet; Take 1 tablet (1 mg total) by mouth 2 (two) times daily as needed for anxiety.  Dispense: 60 tablet; Refill: 2   Caroline Longie Asad A[facility] 08/03/2015 12:05 PM

## 2015-08-15 ENCOUNTER — Ambulatory Visit: Payer: 59 | Admitting: Family Medicine

## 2015-10-24 ENCOUNTER — Ambulatory Visit: Payer: 59 | Admitting: Family Medicine

## 2015-10-29 ENCOUNTER — Ambulatory Visit (INDEPENDENT_AMBULATORY_CARE_PROVIDER_SITE_OTHER): Payer: 59 | Admitting: Family Medicine

## 2015-10-29 ENCOUNTER — Encounter: Payer: Self-pay | Admitting: Family Medicine

## 2015-10-29 VITALS — BP 128/70 | HR 79 | Temp 98.5°F | Resp 16 | Ht 64.0 in | Wt 250.9 lb

## 2015-10-29 DIAGNOSIS — F3341 Major depressive disorder, recurrent, in partial remission: Secondary | ICD-10-CM

## 2015-10-29 DIAGNOSIS — F410 Panic disorder [episodic paroxysmal anxiety] without agoraphobia: Secondary | ICD-10-CM

## 2015-10-29 DIAGNOSIS — G4700 Insomnia, unspecified: Secondary | ICD-10-CM | POA: Diagnosis not present

## 2015-10-29 MED ORDER — CLONAZEPAM 1 MG PO TABS: 1.0000 mg | ORAL_TABLET | Freq: Two times a day (BID) | ORAL | Status: DC | PRN

## 2015-10-29 MED ORDER — ESZOPICLONE 3 MG PO TABS: 3.0000 mg | ORAL_TABLET | Freq: Every evening | ORAL | Status: DC | PRN

## 2015-10-29 NOTE — Progress Notes (Signed)
Name: Mary Nielsen   MRN: [id number]    DOB: [birth year]   Date:10/29/2015       Progress Note  Subjective  Chief Complaint  Chief Complaint  Patient presents with  . Medication Refill    clonazepam 1 mg / lunesta 2 mg / sertraline 100 mg    Anxiety Presents for follow-up visit. The problem has been unchanged. Symptoms include depressed mood, excessive worry, insomnia, nervous/anxious behavior and panic. Patient reports no suicidal ideas. The severity of symptoms is moderate and causing significant distress.   Her past medical history is significant for depression. Past treatments include benzodiazephines and SSRIs. Compliance with prior treatments has been good.  Depression      The patient presents with depression.  This is a chronic problem.  The problem has been gradually worsening since onset.  Associated symptoms include insomnia, decreased interest and sad.  Associated symptoms include no headaches and no suicidal ideas.( Feels as if Sertraline is no longer effective, feels tired, fatigued during the day, difficulty sleeping at night, no motivation, and often puts off work.)  Past treatments include SSRIs - Selective serotonin reuptake inhibitors.  Past medical history includes anxiety and depression.   Insomnia Primary symptoms: difficulty falling asleep, malaise/fatigue.  The problem is unchanged. The symptoms are aggravated by anxiety and work stress. Past treatments include medication. Typical bedtime:  11-12 P.M..  How long after going to bed to you fall asleep: over an hour (falss asleep over three hours after she takes [place].).   PMH includes: depression, family stress or anxiety, work related stressors.     Past Medical History  Diagnosis Date  . Depression   . Anxiety   . GERD (gastroesophageal reflux disease)     Past Surgical History  Procedure Laterality Date  . Microdiscectomy lumbar  2013    Dr. Tonita Cong    Family History  Problem Relation Age of Onset  .  Cancer Father     Mouth Cancer  . Heart disease Maternal Grandmother   . Diabetes Maternal Grandmother   . Cancer Paternal Grandmother     Breast Cancer    Social History   Social History  . Marital Status: Single    Spouse Name: N/A  . Number of Children: N/A  . Years of Education: N/A   Occupational History  . Not on file.   Social History Main Topics  . Smoking status: Former Research scientist (life sciences)  . Smokeless tobacco: Never Used  . Alcohol Use: No  . Drug Use: No  . Sexual Activity: Not on file   Other Topics Concern  . Not on file   Social History Narrative     Current outpatient prescriptions:  .  clonazePAM (KLONOPIN) 1 MG tablet, Take 1 tablet (1 mg total) by mouth 2 (two) times daily as needed for anxiety., Disp: 60 tablet, Rfl: 2 .  eszopiclone (LUNESTA) 2 MG TABS tablet, Take 1 tablet (2 mg total) by mouth at bedtime as needed., Disp: 30 tablet, Rfl: 2 .  sertraline (ZOLOFT) 100 MG tablet, Take 1 tablet (100 mg total) by mouth 2 (two) times daily., Disp: 60 tablet, Rfl: 2  Allergies  Allergen Reactions  . Codeine Itching    Review of Systems  Constitutional: Positive for malaise/fatigue.  Neurological: Negative for headaches.  Psychiatric/Behavioral: Positive for depression. Negative for suicidal ideas and substance abuse. The patient is nervous/anxious and has insomnia.      Objective  Filed Vitals:   10/29/15 1603  BP: 128/70  Pulse: 79  Temp: 98.5 F (36.9 C)  TempSrc: Oral  Resp: 16  Height: 5\' 4"  (1.626 m)  Weight: 250 lb 14.4 oz (113.807 kg)  SpO2: 99%    Physical Exam  Constitutional: She is oriented to person, place, and time and well-developed, well-nourished, and in no distress.  HENT:  Head: Normocephalic and atraumatic.  Cardiovascular: Normal rate and regular rhythm.   Pulmonary/Chest: Effort normal and breath sounds normal.  Neurological: She is alert and oriented to person, place, and time.  Psychiatric: Mood, memory, affect and  judgment normal.  Nursing note and vitals reviewed.    Assessment & Plan  1. Insomnia, persistent Increase Lunesta from 2 mg to 3 mg at bedtime for optimal treatment of symptoms of insomnia - eszopiclone 3 MG TABS; Take 1 tablet (3 mg total) by mouth at bedtime as needed.  Dispense: 90 tablet; Refill: 0  2. Panic disorder without agoraphobia Relatively unchanged, continue on clonazepam as needed - clonazePAM (KLONOPIN) 1 MG tablet; Take 1 tablet (1 mg total) by mouth 2 (two) times daily as needed for anxiety.  Dispense: 60 tablet; Refill: 2  3. Recurrent major depressive disorder, in partial remission ([place]) Recurrence of depressive symptoms,(fatigue, and depressed mood, lack of motivation). Will taper down sertraline over 3 weeks and then restart on alternative antidepressant therapy.    Recardo Linn [facility] 10/29/2015 4:14 PM

## 2015-10-31 ENCOUNTER — Ambulatory Visit: Payer: 59 | Admitting: Family Medicine

## 2015-11-19 ENCOUNTER — Ambulatory Visit (INDEPENDENT_AMBULATORY_CARE_PROVIDER_SITE_OTHER): Payer: 59 | Admitting: Family Medicine

## 2015-11-19 ENCOUNTER — Encounter: Payer: Self-pay | Admitting: Family Medicine

## 2015-11-19 VITALS — BP 126/73 | HR 100 | Temp 98.7°F | Resp 18 | Ht 64.0 in | Wt 247.2 lb

## 2015-11-19 DIAGNOSIS — J020 Streptococcal pharyngitis: Secondary | ICD-10-CM | POA: Diagnosis not present

## 2015-11-19 DIAGNOSIS — F325 Major depressive disorder, single episode, in full remission: Secondary | ICD-10-CM | POA: Diagnosis not present

## 2015-11-19 DIAGNOSIS — J029 Acute pharyngitis, unspecified: Secondary | ICD-10-CM | POA: Diagnosis not present

## 2015-11-19 MED ORDER — PENICILLIN V POTASSIUM 500 MG PO TABS: 500.0000 mg | ORAL_TABLET | Freq: Two times a day (BID) | ORAL | Status: DC

## 2015-11-19 NOTE — Progress Notes (Signed)
Name: Mary Nielsen   MRN: [id number]    DOB: Apr 19, 1986   Date:11/19/2015       Progress Note  Subjective  Chief Complaint  Chief Complaint  Patient presents with  . Follow-up    3 wk     Depression      The patient presents with depression.  This is a recurrent problem.  The onset quality is gradual.   The problem has been gradually improving since onset.  Associated symptoms include insomnia.  Associated symptoms include no decreased concentration, no fatigue, no hopelessness and not sad.  Past treatments include SSRIs - Selective serotonin reuptake inhibitors.  Compliance with treatment is good.  Past compliance problems: SInce she was tapered off Sertraline last week, she feels better, more 'clear' no symptoms of depression.  Past medical history includes anxiety and depression.   Cough This is a new problem. The current episode started in the past 7 days (Started last week, with sore throat and fevers. ). The cough is productive of purulent sputum. Associated symptoms include ear congestion and a sore throat. Pertinent negatives include no chills or postnasal drip. Treatments tried: Tylenol for fever and body aches.      Past Medical History  Diagnosis Date  . Depression   . Anxiety   . GERD (gastroesophageal reflux disease)     Past Surgical History  Procedure Laterality Date  . Microdiscectomy lumbar  2013    Dr. Tonita Cong    Family History  Problem Relation Age of Onset  . Cancer Father     Mouth Cancer  . Heart disease Maternal Grandmother   . Diabetes Maternal Grandmother   . Cancer Paternal Grandmother     Breast Cancer    Social History   Social History  . Marital Status: Single    Spouse Name: N/A  . Number of Children: N/A  . Years of Education: N/A   Occupational History  . Not on file.   Social History Main Topics  . Smoking status: Former Research scientist (life sciences)  . Smokeless tobacco: Never Used  . Alcohol Use: No  . Drug Use: No  . Sexual Activity: Not on file    Other Topics Concern  . Not on file   Social History Narrative     Current outpatient prescriptions:  .  clonazePAM (KLONOPIN) 1 MG tablet, Take 1 tablet (1 mg total) by mouth 2 (two) times daily as needed for anxiety., Disp: 60 tablet, Rfl: 2 .  eszopiclone 3 MG TABS, Take 1 tablet (3 mg total) by mouth at bedtime as needed., Disp: 90 tablet, Rfl: 0  Allergies  Allergen Reactions  . Codeine Itching     Review of Systems  Constitutional: Negative for chills and fatigue.  HENT: Positive for congestion and sore throat. Negative for postnasal drip.   Respiratory: Positive for cough.   Psychiatric/Behavioral: Positive for depression. Negative for decreased concentration. The patient is nervous/anxious and has insomnia.      Objective  Filed Vitals:   11/19/15 1428  BP: 126/73  Pulse: 100  Temp: 98.7 F (37.1 C)  TempSrc: Oral  Resp: 18  Height: 5\' 4"  (1.626 m)  Weight: 247 lb 3.2 oz (112.129 kg)  SpO2: 96%    Physical Exam  Constitutional: She is well-developed, well-nourished, and in no distress.  HENT:  Right Ear: Tympanic membrane and ear canal normal. No drainage.  Left Ear: Tympanic membrane and ear canal normal. No drainage.  Tonsillar hypertrophy, erythema and edema, no exudate.  Cardiovascular: Normal rate and regular rhythm.   Pulmonary/Chest: Effort normal and breath sounds normal.  Nursing note and vitals reviewed.       Assessment & Plan  1. Sore throat Rapid strep test is positive - POCT rapid strep A  2. Depression, major, in remission [facility]) Patient reports an improvement in her mood symptoms after discontinuation of sertraline. We will reevaluate in 6-8 weeks. No need for antidepressant therapy at this time.  3. Acute streptococcal pharyngitis Started on penicillin, prescription sent to pharmacy - penicillin v potassium (VEETID) 500 MG tablet; Take 1 tablet (500 mg total) by mouth 2 (two) times daily after a meal.  Dispense: 20 tablet;  Refill: 0  Everlean Bucher [facility] 11/19/2015 2:39 PM

## 2016-01-21 ENCOUNTER — Encounter: Payer: Self-pay | Admitting: Family Medicine

## 2016-01-21 ENCOUNTER — Ambulatory Visit (INDEPENDENT_AMBULATORY_CARE_PROVIDER_SITE_OTHER): Payer: 59 | Admitting: Family Medicine

## 2016-01-21 VITALS — BP 132/84 | HR 86 | Temp 98.1°F | Resp 14 | Wt 256.0 lb

## 2016-01-21 DIAGNOSIS — F410 Panic disorder [episodic paroxysmal anxiety] without agoraphobia: Secondary | ICD-10-CM

## 2016-01-21 DIAGNOSIS — G4700 Insomnia, unspecified: Secondary | ICD-10-CM

## 2016-01-21 DIAGNOSIS — F339 Major depressive disorder, recurrent, unspecified: Secondary | ICD-10-CM

## 2016-01-21 MED ORDER — VENLAFAXINE HCL ER 75 MG PO CP24: 75.0000 mg | ORAL_CAPSULE | Freq: Every day | ORAL | 2 refills | Status: DC

## 2016-01-21 MED ORDER — ESZOPICLONE 3 MG PO TABS: 3.0000 mg | ORAL_TABLET | Freq: Every evening | ORAL | 0 refills | Status: DC | PRN

## 2016-01-21 MED ORDER — CLONAZEPAM 1 MG PO TABS: 1.0000 mg | ORAL_TABLET | Freq: Two times a day (BID) | ORAL | 2 refills | Status: DC | PRN

## 2016-01-21 NOTE — Progress Notes (Signed)
Name: Mary Nielsen   MRN: [id number]    DOB: 12-17-1985   Date:01/21/2016       Progress Note  Subjective  Chief Complaint  Chief Complaint  Patient presents with  . Follow-up  . Depression    with symptoms, anxiety attacks night terrors, crying,hopelessness    Depression       The patient presents with depression.  This is a recurrent problem.  The onset quality is gradual.   Associated symptoms include hopelessness, insomnia, irritable and decreased interest.  Past treatments include nothing (In the past, she has been on SSRI with good response.).  Past medical history includes depression.   Went off sertraline 8 weeks ago as was doing very well symptomatically, has noticed recurrence of symptoms of depression in the last couple weeks, includes irritability, and hopelessness.  Past Medical History:  Diagnosis Date  . Anxiety   . Depression   . GERD (gastroesophageal reflux disease)     Past Surgical History:  Procedure Laterality Date  . MICRODISCECTOMY LUMBAR  2013   Dr. Tonita Cong    Family History  Problem Relation Age of Onset  . Cancer Father     Mouth Cancer  . Heart disease Maternal Grandmother   . Diabetes Maternal Grandmother   . Cancer Paternal Grandmother     Breast Cancer    Social History   Social History  . Marital status: Single    Spouse name: N/A  . Number of children: N/A  . Years of education: N/A   Occupational History  . Not on file.   Social History Main Topics  . Smoking status: Former Research scientist (life sciences)  . Smokeless tobacco: Never Used  . Alcohol use No  . Drug use: No  . Sexual activity: Not on file   Other Topics Concern  . Not on file   Social History Narrative  . No narrative on file     Current Outpatient Prescriptions:  .  clonazePAM (KLONOPIN) 1 MG tablet, Take 1 tablet (1 mg total) by mouth 2 (two) times daily as needed for anxiety., Disp: 60 tablet, Rfl: 2 .  eszopiclone 3 MG TABS, Take 1 tablet (3 mg total) by mouth at bedtime as  needed., Disp: 90 tablet, Rfl: 0  Allergies  Allergen Reactions  . Codeine Itching     Review of Systems  Constitutional: Positive for malaise/fatigue.  Psychiatric/Behavioral: Positive for depression. The patient is nervous/anxious and has insomnia.      Objective  Vitals:   01/21/16 1400  BP: 132/84  Pulse: 86  Resp: 14  Temp: 98.1 F (36.7 C)  TempSrc: Oral  SpO2: 97%  Weight: 256 lb (116.1 kg)    Physical Exam  Constitutional: She is oriented to person, place, and time and well-developed, well-nourished, and in no distress. She is irritable.  Cardiovascular: Normal rate, regular rhythm and normal heart sounds.   No murmur heard. Pulmonary/Chest: Effort normal and breath sounds normal. She has no wheezes.  Neurological: She is alert and oriented to person, place, and time.  Psychiatric: Memory, affect and judgment normal. Her mood appears anxious.  Nursing note and vitals reviewed.     Assessment & Plan  1. Major depression, recurrent, chronic (HCC) We will start on venlafaxine X are 75 mg daily, explained that it will take 4-6 weeks to achieve maximum efficacy. - venlafaxine XR (EFFEXOR-XR) 75 MG 24 hr capsule; Take 1 capsule (75 mg total) by mouth daily with breakfast.  Dispense: 30 capsule; Refill: 2  2. Insomnia, persistent Table, refills provided - Eszopiclone 3 MG TABS; Take 1 tablet (3 mg total) by mouth at bedtime as needed.  Dispense: 90 tablet; Refill: 0  3. Panic disorder without agoraphobia Has been expressing more irritability and anxiety with worsening recurrent depression. Continue on clonazepam at present dosage and follow-up in 6 weeks - clonazePAM (KLONOPIN) 1 MG tablet; Take 1 tablet (1 mg total) by mouth 2 (two) times daily as needed for anxiety.  Dispense: 60 tablet; Refill: 2   Fredonia Casalino [facility] 01/21/2016 2:05 PM

## 2016-03-03 ENCOUNTER — Ambulatory Visit: Payer: 59 | Admitting: Family Medicine

## 2016-04-16 ENCOUNTER — Encounter: Payer: Self-pay | Admitting: Family Medicine

## 2016-04-16 ENCOUNTER — Ambulatory Visit (INDEPENDENT_AMBULATORY_CARE_PROVIDER_SITE_OTHER): Payer: 59 | Admitting: Family Medicine

## 2016-04-16 DIAGNOSIS — F410 Panic disorder [episodic paroxysmal anxiety] without agoraphobia: Secondary | ICD-10-CM

## 2016-04-16 DIAGNOSIS — G4700 Insomnia, unspecified: Secondary | ICD-10-CM

## 2016-04-16 DIAGNOSIS — F339 Major depressive disorder, recurrent, unspecified: Secondary | ICD-10-CM

## 2016-04-16 MED ORDER — ESZOPICLONE 3 MG PO TABS: 3.0000 mg | ORAL_TABLET | Freq: Every evening | ORAL | 0 refills | Status: DC | PRN

## 2016-04-16 MED ORDER — VENLAFAXINE HCL ER 75 MG PO CP24: 75.0000 mg | ORAL_CAPSULE | Freq: Every day | ORAL | 0 refills | Status: DC

## 2016-04-16 MED ORDER — CLONAZEPAM 1 MG PO TABS: 1.0000 mg | ORAL_TABLET | Freq: Two times a day (BID) | ORAL | 2 refills | Status: DC | PRN

## 2016-04-16 NOTE — Progress Notes (Signed)
Name: Mary Nielsen   MRN: [id number]    DOB: [birth year]   Date:04/16/2016       Progress Note  Subjective  Chief Complaint  Chief Complaint  Patient presents with  . Medication Refill    Anxiety  Presents for follow-up visit. Symptoms include depressed mood, excessive worry, insomnia, nervous/anxious behavior and panic. Patient reports no suicidal ideas. The severity of symptoms is moderate and causing significant distress.   Her past medical history is significant for depression.  Depression       The patient presents with depression.  This is a chronic problem.  The problem has been gradually improving since onset.  Associated symptoms include fatigue, insomnia, decreased interest and sad.  Associated symptoms include no headaches and no suicidal ideas.  Past treatments include SNRIs - Serotonin and norepinephrine reuptake inhibitors.  Compliance with treatment is good.  Past medical history includes anxiety and depression.   Insomnia  Primary symptoms: difficulty falling asleep, malaise/fatigue.  The problem occurs nightly. The problem is unchanged. The symptoms are aggravated by anxiety. Past treatments include medication. Typical bedtime:  11-12 P.M..  How long after going to bed to you fall asleep: over an hour (falss asleep over three hours after she takes [place].).   PMH includes: depression, family stress or anxiety, work related stressors.    Past Medical History:  Diagnosis Date  . Anxiety   . Depression   . GERD (gastroesophageal reflux disease)     Past Surgical History:  Procedure Laterality Date  . MICRODISCECTOMY LUMBAR  2013   Dr. Tonita Cong    Family History  Problem Relation Age of Onset  . Cancer Father     Mouth Cancer  . Heart disease Maternal Grandmother   . Diabetes Maternal Grandmother   . Cancer Paternal Grandmother     Breast Cancer    Social History   Social History  . Marital status: Single    Spouse name: N/A  . Number of children: N/A  .  Years of education: N/A   Occupational History  . Not on file.   Social History Main Topics  . Smoking status: Former Research scientist (life sciences)  . Smokeless tobacco: Never Used  . Alcohol use No  . Drug use: No  . Sexual activity: Not on file   Other Topics Concern  . Not on file   Social History Narrative  . No narrative on file     Current Outpatient Prescriptions:  .  clonazePAM (KLONOPIN) 1 MG tablet, Take 1 tablet (1 mg total) by mouth 2 (two) times daily as needed for anxiety., Disp: 60 tablet, Rfl: 2 .  Eszopiclone 3 MG TABS, Take 1 tablet (3 mg total) by mouth at bedtime as needed., Disp: 90 tablet, Rfl: 0 .  venlafaxine XR (EFFEXOR-XR) 75 MG 24 hr capsule, Take 1 capsule (75 mg total) by mouth daily with breakfast., Disp: 30 capsule, Rfl: 2  Allergies  Allergen Reactions  . Codeine Itching     Review of Systems  Constitutional: Positive for fatigue and malaise/fatigue.  Neurological: Negative for headaches.  Psychiatric/Behavioral: Positive for depression. Negative for suicidal ideas. The patient is nervous/anxious and has insomnia.     Objective  Vitals:   04/16/16 1428  BP: 131/70  Pulse: (!) 103  Resp: 18  Temp: 98 F (36.7 C)  TempSrc: Oral  SpO2: 96%  Weight: 252 lb 6.4 oz (114.5 kg)  Height: 5\' 4"  (1.626 m)    Physical Exam  Constitutional: She is oriented  to person, place, and time and well-developed, well-nourished, and in no distress.  HENT:  Head: Normocephalic and atraumatic.  Cardiovascular: Normal rate and regular rhythm.   Pulmonary/Chest: Effort normal and breath sounds normal.  Neurological: She is alert and oriented to person, place, and time.  Psychiatric: Mood, memory, affect and judgment normal.  Nursing note and vitals reviewed.     Assessment & Plan  1. Insomnia, persistent Insomnia responsive to Lunesta taken every night at bedtime. - Eszopiclone 3 MG TABS; Take 1 tablet (3 mg total) by mouth at bedtime as needed.  Dispense: 90 tablet;  Refill: 0  2. Major depression, recurrent, chronic (HCC) Symptoms of major depression are improving on venlafaxine  75 mg, refills provided - venlafaxine XR (EFFEXOR-XR) 75 MG 24 hr capsule; Take 1 capsule (75 mg total) by mouth daily with breakfast.  Dispense: 90 capsule; Refill: 0  3. Panic disorder without agoraphobia Stable and responsive to clonazepam taken twice daily as needed. Refills provided - clonazePAM (KLONOPIN) 1 MG tablet; Take 1 tablet (1 mg total) by mouth 2 (two) times daily as needed for anxiety.  Dispense: 60 tablet; Refill: 2   Devlin Mcveigh [facility] 04/16/2016 2:35 PM

## 2016-07-17 ENCOUNTER — Ambulatory Visit (INDEPENDENT_AMBULATORY_CARE_PROVIDER_SITE_OTHER): Payer: 59 | Admitting: Family Medicine

## 2016-07-17 ENCOUNTER — Encounter: Payer: Self-pay | Admitting: Family Medicine

## 2016-07-17 DIAGNOSIS — G4700 Insomnia, unspecified: Secondary | ICD-10-CM | POA: Diagnosis not present

## 2016-07-17 DIAGNOSIS — F339 Major depressive disorder, recurrent, unspecified: Secondary | ICD-10-CM | POA: Diagnosis not present

## 2016-07-17 DIAGNOSIS — F410 Panic disorder [episodic paroxysmal anxiety] without agoraphobia: Secondary | ICD-10-CM

## 2016-07-17 MED ORDER — ESZOPICLONE 3 MG PO TABS: 3.0000 mg | ORAL_TABLET | Freq: Every evening | ORAL | 0 refills | Status: DC | PRN

## 2016-07-17 MED ORDER — CLONAZEPAM 1 MG PO TABS: 1.0000 mg | ORAL_TABLET | Freq: Two times a day (BID) | ORAL | 2 refills | Status: DC | PRN

## 2016-07-17 MED ORDER — VENLAFAXINE HCL ER 75 MG PO CP24: 75.0000 mg | ORAL_CAPSULE | Freq: Every day | ORAL | 0 refills | Status: DC

## 2016-07-17 NOTE — Progress Notes (Signed)
Name: Mary Nielsen   MRN: [id number]    DOB: 1986-03-13   Date:07/17/2016       Progress Note  Subjective  Chief Complaint  Chief Complaint  Patient presents with  . Follow-up    3 mo  . Medication Refill    Anxiety  Presents for follow-up visit. Symptoms include depressed mood, excessive worry, insomnia and nervous/anxious behavior. Patient reports no panic or suicidal ideas. Symptoms occur most days. The severity of symptoms is moderate (overall improved.).   Her past medical history is significant for depression.  Depression       The patient presents with depression.  This is a chronic problem.  The problem has been gradually improving since onset.  Associated symptoms include fatigue, insomnia and decreased interest.  Associated symptoms include no headaches, not sad and no suicidal ideas.  Past treatments include SNRIs - Serotonin and norepinephrine reuptake inhibitors.  Compliance with treatment is good.  Past medical history includes anxiety and depression.   Insomnia  Primary symptoms: difficulty falling asleep, malaise/fatigue.  The problem occurs nightly. The problem is unchanged. The symptoms are aggravated by anxiety. Past treatments include medication. Typical bedtime:  11-12 P.M..  How long after going to bed to you fall asleep: over an hour (falss asleep over three hours after she takes [place].).   PMH includes: depression, family stress or anxiety, work related stressors (overall improved.).    Past Medical History:  Diagnosis Date  . Anxiety   . Depression   . GERD (gastroesophageal reflux disease)     Past Surgical History:  Procedure Laterality Date  . MICRODISCECTOMY LUMBAR  2013   Dr. Tonita Cong    Family History  Problem Relation Age of Onset  . Cancer Father     Mouth Cancer  . Heart disease Maternal Grandmother   . Diabetes Maternal Grandmother   . Cancer Paternal Grandmother     Breast Cancer    Social History   Social History  . Marital status:  Single    Spouse name: N/A  . Number of children: N/A  . Years of education: N/A   Occupational History  . Not on file.   Social History Main Topics  . Smoking status: Former Research scientist (life sciences)  . Smokeless tobacco: Never Used  . Alcohol use No  . Drug use: No  . Sexual activity: Not on file   Other Topics Concern  . Not on file   Social History Narrative  . No narrative on file     Current Outpatient Prescriptions:  .  clonazePAM (KLONOPIN) 1 MG tablet, Take 1 tablet (1 mg total) by mouth 2 (two) times daily as needed for anxiety., Disp: 60 tablet, Rfl: 2 .  Eszopiclone 3 MG TABS, Take 1 tablet (3 mg total) by mouth at bedtime as needed., Disp: 90 tablet, Rfl: 0 .  venlafaxine XR (EFFEXOR-XR) 75 MG 24 hr capsule, Take 1 capsule (75 mg total) by mouth daily with breakfast., Disp: 90 capsule, Rfl: 0  Allergies  Allergen Reactions  . Codeine Itching     Review of Systems  Constitutional: Positive for fatigue and malaise/fatigue.  Neurological: Negative for headaches.  Psychiatric/Behavioral: Positive for depression. Negative for suicidal ideas. The patient is nervous/anxious and has insomnia.      Objective  Vitals:   07/17/16 1343  BP: 132/73  Pulse: 90  Resp: 17  Temp: 98.4 F (36.9 C)  TempSrc: Oral  SpO2: 96%  Weight: 245 lb 8 oz (111.4 kg)  Height:  5\' 4"  (1.626 m)    Physical Exam  Constitutional: She is oriented to person, place, and time and well-developed, well-nourished, and in no distress.  HENT:  Head: Normocephalic and atraumatic.  Cardiovascular: Normal rate and regular rhythm.   Pulmonary/Chest: Effort normal and breath sounds normal.  Neurological: She is alert and oriented to person, place, and time.  Psychiatric: Mood, memory, affect and judgment normal.  Nursing note and vitals reviewed.    Assessment & Plan  1. Insomnia, persistent Stable and responsive to Lunesta taken at bedtime as needed - Eszopiclone 3 MG TABS; Take 1 tablet (3 mg  total) by mouth at bedtime as needed.  Dispense: 90 tablet; Refill: 0  2. Major depression, recurrent, chronic (HCC) Symptoms overall improved, continue on venlafaxine daily - venlafaxine XR (EFFEXOR-XR) 75 MG 24 hr capsule; Take 1 capsule (75 mg total) by mouth daily with breakfast.  Dispense: 90 capsule; Refill: 0  3. Panic disorder without agoraphobia  - clonazePAM (KLONOPIN) 1 MG tablet; Take 1 tablet (1 mg total) by mouth 2 (two) times daily as needed for anxiety.  Dispense: 60 tablet; Refill: 2   [name] [facility] 07/17/2016 1:52 PM

## 2016-08-13 ENCOUNTER — Encounter: Payer: 59 | Admitting: Family Medicine

## 2016-10-15 ENCOUNTER — Ambulatory Visit: Payer: 59 | Admitting: Family Medicine

## 2016-11-26 ENCOUNTER — Telehealth: Payer: Self-pay | Admitting: Family Medicine

## 2016-11-26 DIAGNOSIS — F410 Panic disorder [episodic paroxysmal anxiety] without agoraphobia: Secondary | ICD-10-CM

## 2016-11-26 MED ORDER — CLONAZEPAM 1 MG PO TABS: 1.0000 mg | ORAL_TABLET | Freq: Two times a day (BID) | ORAL | 0 refills | Status: DC | PRN

## 2016-11-26 NOTE — Telephone Encounter (Signed)
Requesting return call to discuss a refill. States she starts a new job next month therefore right now she can not afford to come in. She is asking that you give her one refill of clonazepam and send it to [facility]. She will schedule an appointment for next month. Last seen 07/17/16. She is asking for return call to discuss further. [phone]

## 2016-11-26 NOTE — Telephone Encounter (Signed)
She is starting a new job in July but won't have insurance until August and requesting refill on clonazepam for a month until she can come back in for appointment. We'll authorize one month supply of clonazepam and patient will pick up prescription tomorrow.

## 2016-11-26 NOTE — Telephone Encounter (Signed)
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 States she starts a new job next month therefore right now she can not afford to come in. She is asking that you give her one refill of clonazepam and send it to [facility]. She will schedule an appointment for next month. Last seen 07/17/16. She is asking for return call to discuss further. [phone]

## 2016-11-27 NOTE — Telephone Encounter (Signed)
Medication has been called in to [facility]

## 2016-12-30 ENCOUNTER — Ambulatory Visit (INDEPENDENT_AMBULATORY_CARE_PROVIDER_SITE_OTHER): Payer: 59 | Admitting: Family Medicine

## 2016-12-30 ENCOUNTER — Encounter: Payer: Self-pay | Admitting: Family Medicine

## 2016-12-30 ENCOUNTER — Ambulatory Visit: Payer: 59 | Admitting: Family Medicine

## 2016-12-30 DIAGNOSIS — G4700 Insomnia, unspecified: Secondary | ICD-10-CM | POA: Diagnosis not present

## 2016-12-30 DIAGNOSIS — F339 Major depressive disorder, recurrent, unspecified: Secondary | ICD-10-CM | POA: Diagnosis not present

## 2016-12-30 DIAGNOSIS — F410 Panic disorder [episodic paroxysmal anxiety] without agoraphobia: Secondary | ICD-10-CM | POA: Diagnosis not present

## 2016-12-30 MED ORDER — CLONAZEPAM 1 MG PO TABS: 1.0000 mg | ORAL_TABLET | Freq: Two times a day (BID) | ORAL | 0 refills | Status: DC | PRN

## 2016-12-30 MED ORDER — ESZOPICLONE 1 MG PO TABS: 1.0000 mg | ORAL_TABLET | Freq: Every evening | ORAL | 0 refills | Status: DC | PRN

## 2016-12-30 MED ORDER — VENLAFAXINE HCL ER 75 MG PO CP24: 75.0000 mg | ORAL_CAPSULE | Freq: Every day | ORAL | 0 refills | Status: DC

## 2016-12-30 NOTE — Progress Notes (Signed)
Name: Mary Nielsen   MRN: [id number]    DOB: 04/19/1986   Date:12/30/2016       Progress Note  Subjective  Chief Complaint  Chief Complaint  Patient presents with  . Medication Refill    Anxiety  Presents for follow-up visit. Symptoms include depressed mood, excessive worry, insomnia, nervous/anxious behavior and panic. Patient reports no shortness of breath or suicidal ideas. Symptoms occur most days. The severity of symptoms is moderate and causing significant distress (worse after she had to stop taking the medication 5 months ago when she was unable to afford them).   Her past medical history is significant for depression.  Depression       The patient presents with depression.  This is a chronic problem.  The problem has been gradually worsening since onset.  Associated symptoms include fatigue, insomnia, decreased interest, body aches and sad.  Associated symptoms include no helplessness, no hopelessness, no headaches and no suicidal ideas.  Past treatments include SNRIs - Serotonin and norepinephrine reuptake inhibitors.  Compliance with treatment is good.  Past compliance problems include insurance issues (stopped taking Venlafaxine becasue could not afford it.).  Past medical history includes anxiety and depression.   Insomnia  Primary symptoms: difficulty falling asleep, malaise/fatigue.  The onset quality is gradual. The problem occurs nightly. The problem has been gradually worsening since onset. The symptoms are aggravated by anxiety. Past treatments include medication. Typical bedtime:  11-12 P.M..  How long after going to bed to you fall asleep: over an hour.   PMH includes: depression, family stress or anxiety, work related stressors (overall improved.).    Past Medical History:  Diagnosis Date  . Anxiety   . Depression   . GERD (gastroesophageal reflux disease)     Past Surgical History:  Procedure Laterality Date  . MICRODISCECTOMY LUMBAR  2013   Dr. Tonita Cong     Family History  Problem Relation Age of Onset  . Cancer Father        Mouth Cancer  . Heart disease Maternal Grandmother   . Diabetes Maternal Grandmother   . Cancer Paternal Grandmother        Breast Cancer    Social History   Social History  . Marital status: Single    Spouse name: N/A  . Number of children: N/A  . Years of education: N/A   Occupational History  . Not on file.   Social History Main Topics  . Smoking status: Former Research scientist (life sciences)  . Smokeless tobacco: Never Used  . Alcohol use No  . Drug use: No  . Sexual activity: Not on file   Other Topics Concern  . Not on file   Social History Narrative  . No narrative on file     Current Outpatient Prescriptions:  .  clonazePAM (KLONOPIN) 1 MG tablet, Take 1 tablet (1 mg total) by mouth 2 (two) times daily as needed for anxiety., Disp: 60 tablet, Rfl: 0 .  Eszopiclone 3 MG TABS, Take 1 tablet (3 mg total) by mouth at bedtime as needed., Disp: 90 tablet, Rfl: 0 .  venlafaxine XR (EFFEXOR-XR) 75 MG 24 hr capsule, Take 1 capsule (75 mg total) by mouth daily with breakfast., Disp: 90 capsule, Rfl: 0  Allergies  Allergen Reactions  . Codeine Itching     Review of Systems  Constitutional: Positive for fatigue and malaise/fatigue.  Respiratory: Negative for shortness of breath.   Neurological: Negative for headaches.  Psychiatric/Behavioral: Positive for depression. Negative for suicidal ideas.  The patient is nervous/anxious and has insomnia.      Objective  Vitals:   12/30/16 1453  BP: 132/78  Pulse: 100  Resp: 17  Temp: 98.1 F (36.7 C)  TempSrc: Oral  SpO2: 95%  Weight: 246 lb 6.4 oz (111.8 kg)  Height: 5\' 4"  (1.626 m)    Physical Exam  Constitutional: She is oriented to person, place, and time and well-developed, well-nourished, and in no distress.  HENT:  Head: Normocephalic and atraumatic.  Cardiovascular: Normal rate, regular rhythm and normal heart sounds.   No murmur  heard. Pulmonary/Chest: Effort normal and breath sounds normal. She has no wheezes.  Neurological: She is alert and oriented to person, place, and time.  Psychiatric: Mood, memory and judgment normal. She has a flat affect.  Nursing note and vitals reviewed.      Assessment & Plan  1. Insomnia, persistent Restart on Lunesta 1 mg at bedtime, advance as tolerated based on patient's response - eszopiclone (LUNESTA) 1 MG TABS tablet; Take 1 tablet (1 mg total) by mouth at bedtime as needed for sleep. Take immediately before bedtime  Dispense: 30 tablet; Refill: 0  2. Major depression, recurrent, chronic (HCC) Restart on Effexor XR 75 mg for treatment of symptoms of depression, follow-up in 6 weeks - venlafaxine XR (EFFEXOR-XR) 75 MG 24 hr capsule; Take 1 capsule (75 mg total) by mouth daily with breakfast.  Dispense: 90 capsule; Refill: 0  3. Panic disorder without agoraphobia  - clonazePAM (KLONOPIN) 1 MG tablet; Take 1 tablet (1 mg total) by mouth 2 (two) times daily as needed for anxiety.  Dispense: 60 tablet; Refill: 0   Narcissa Melder [facility] 12/30/2016 3:13 PM

## 2017-01-29 ENCOUNTER — Ambulatory Visit (INDEPENDENT_AMBULATORY_CARE_PROVIDER_SITE_OTHER): Payer: 59 | Admitting: Family Medicine

## 2017-01-29 ENCOUNTER — Encounter: Payer: Self-pay | Admitting: Family Medicine

## 2017-01-29 VITALS — BP 126/74 | HR 86 | Temp 98.0°F | Resp 16 | Wt 244.3 lb

## 2017-01-29 DIAGNOSIS — G4700 Insomnia, unspecified: Secondary | ICD-10-CM | POA: Diagnosis not present

## 2017-01-29 DIAGNOSIS — F410 Panic disorder [episodic paroxysmal anxiety] without agoraphobia: Secondary | ICD-10-CM | POA: Diagnosis not present

## 2017-01-29 DIAGNOSIS — F325 Major depressive disorder, single episode, in full remission: Secondary | ICD-10-CM

## 2017-01-29 MED ORDER — ESZOPICLONE 2 MG PO TABS: 1.0000 mg | ORAL_TABLET | Freq: Every evening | ORAL | 2 refills | Status: DC | PRN

## 2017-01-29 MED ORDER — ESZOPICLONE 2 MG PO TABS: 2.0000 mg | ORAL_TABLET | Freq: Every evening | ORAL | 2 refills | Status: DC | PRN

## 2017-01-29 MED ORDER — CLONAZEPAM 1 MG PO TABS: 1.0000 mg | ORAL_TABLET | Freq: Two times a day (BID) | ORAL | 2 refills | Status: DC | PRN

## 2017-01-29 NOTE — Progress Notes (Signed)
Name: Mary Nielsen   MRN: [id number]    DOB: [birth year]   Date:01/29/2017       Progress Note  Subjective  Chief Complaint  Chief Complaint  Patient presents with  . Follow-up    Medication follow up on Clonzepam, lunest and venlafaxine    Anxiety  Presents for follow-up visit. Symptoms include depressed mood, excessive worry, insomnia, nervous/anxious behavior and panic. Patient reports no shortness of breath or suicidal ideas. The severity of symptoms is moderate. The quality of sleep is poor.    Depression         This is a recurrent problem.  The onset quality is gradual.   The problem has been gradually improving since onset.  Associated symptoms include fatigue, helplessness, hopelessness, insomnia and sad.  Associated symptoms include no suicidal ideas.  Past treatments include SNRIs - Serotonin and norepinephrine reuptake inhibitors.  Past medical history includes anxiety.   Insomnia  Primary symptoms: difficulty falling asleep, no frequent awakening, no premature morning awakening.  Past treatments include medication. Typical bedtime:  11-12 P.M..  How long after going to bed to you fall asleep: over an hour.   PMH includes: depression.     Past Medical History:  Diagnosis Date  . Anxiety   . Depression   . GERD (gastroesophageal reflux disease)     Past Surgical History:  Procedure Laterality Date  . MICRODISCECTOMY LUMBAR  2013   Dr. Tonita Cong    Family History  Problem Relation Age of Onset  . Cancer Father        Mouth Cancer  . Heart disease Maternal Grandmother   . Diabetes Maternal Grandmother   . Cancer Paternal Grandmother        Breast Cancer    Social History   Social History  . Marital status: Single    Spouse name: N/A  . Number of children: N/A  . Years of education: N/A   Occupational History  . Not on file.   Social History Main Topics  . Smoking status: Former Research scientist (life sciences)  . Smokeless tobacco: Never Used  . Alcohol use No  . Drug use: No   . Sexual activity: Yes   Other Topics Concern  . Not on file   Social History Narrative  . No narrative on file     Current Outpatient Prescriptions:  .  clonazePAM (KLONOPIN) 1 MG tablet, Take 1 tablet (1 mg total) by mouth 2 (two) times daily as needed for anxiety., Disp: 60 tablet, Rfl: 0 .  eszopiclone (LUNESTA) 1 MG TABS tablet, Take 1 tablet (1 mg total) by mouth at bedtime as needed for sleep. Take immediately before bedtime, Disp: 30 tablet, Rfl: 0 .  Multiple Vitamins-Minerals (WOMENS ONE DAILY PO), Take by mouth., Disp: , Rfl:  .  venlafaxine XR (EFFEXOR-XR) 75 MG 24 hr capsule, Take 1 capsule (75 mg total) by mouth daily with breakfast., Disp: 90 capsule, Rfl: 0  Allergies  Allergen Reactions  . Codeine Itching     Review of Systems  Constitutional: Positive for fatigue.  Respiratory: Negative for shortness of breath.   Psychiatric/Behavioral: Positive for depression. Negative for suicidal ideas. The patient is nervous/anxious and has insomnia.      Objective  Vitals:   01/29/17 1427  BP: 126/74  Pulse: 86  Resp: 16  Temp: 98 F (36.7 C)  TempSrc: Oral  SpO2: 97%  Weight: 244 lb 4.8 oz (110.8 kg)    Physical Exam  Constitutional: She is oriented to  person, place, and time and well-developed, well-nourished, and in no distress.  HENT:  Head: Normocephalic and atraumatic.  Cardiovascular: Normal rate, regular rhythm and normal heart sounds.   No murmur heard. Pulmonary/Chest: Effort normal and breath sounds normal. She has no wheezes.  Abdominal: Soft. Bowel sounds are normal. There is no tenderness.  Neurological: She is alert and oriented to person, place, and time.  Psychiatric: Mood, memory, affect and judgment normal.  Nursing note and vitals reviewed.     Assessment & Plan  1. Insomnia, persistent Patient is still unable to fall asleep after taking Lunesta 1 mg, we'll increase to Lunesta 2 mg at bedtime and follow-up in three months -  eszopiclone (LUNESTA) 2 MG TABS tablet; Take 0.5 tablets (1 mg total) by mouth at bedtime as needed for sleep. Take immediately before bedtime  Dispense: 30 tablet; Refill: 2  2. Panic disorder without agoraphobia  - clonazePAM (KLONOPIN) 1 MG tablet; Take 1 tablet (1 mg total) by mouth 2 (two) times daily as needed for anxiety.  Dispense: 60 tablet; Refill: 2  3. Depression, major, in remission ([place]) Improving, continue on Effexor and follow-up in 3 months   Victoria Henshaw [facility] 01/29/2017 2:36 PM

## 2017-01-30 ENCOUNTER — Ambulatory Visit: Payer: 59 | Admitting: Family Medicine

## 2017-04-07 ENCOUNTER — Ambulatory Visit (INDEPENDENT_AMBULATORY_CARE_PROVIDER_SITE_OTHER): Payer: 59 | Admitting: Family Medicine

## 2017-04-07 ENCOUNTER — Encounter: Payer: Self-pay | Admitting: Family Medicine

## 2017-04-07 VITALS — BP 126/78 | HR 102 | Temp 98.2°F | Resp 16 | Ht 64.0 in | Wt 246.0 lb

## 2017-04-07 DIAGNOSIS — F419 Anxiety disorder, unspecified: Secondary | ICD-10-CM

## 2017-04-07 DIAGNOSIS — F5105 Insomnia due to other mental disorder: Secondary | ICD-10-CM

## 2017-04-07 DIAGNOSIS — F339 Major depressive disorder, recurrent, unspecified: Secondary | ICD-10-CM | POA: Diagnosis not present

## 2017-04-07 DIAGNOSIS — F410 Panic disorder [episodic paroxysmal anxiety] without agoraphobia: Secondary | ICD-10-CM | POA: Diagnosis not present

## 2017-04-07 MED ORDER — VENLAFAXINE HCL ER 75 MG PO CP24: 75.0000 mg | ORAL_CAPSULE | Freq: Every day | ORAL | 0 refills | Status: DC

## 2017-04-07 MED ORDER — CLONAZEPAM 1 MG PO TABS: 1.0000 mg | ORAL_TABLET | Freq: Two times a day (BID) | ORAL | 2 refills | Status: DC | PRN

## 2017-04-07 MED ORDER — TRAZODONE HCL 50 MG PO TABS: 50.0000 mg | ORAL_TABLET | Freq: Every evening | ORAL | 0 refills | Status: DC | PRN

## 2017-04-07 NOTE — Progress Notes (Signed)
The following letter was provided to Mary Nielsen during their visit today: ---------------------------------------  Dear valued [facility] Patient,  I am writing to share that as of July 24, 2017, I will no longer be seeing patients at [facility]. While it has been my privilege to care for you as a physician, I have decided to move outside of [place] to pursue other opportunities.  The staff at [facility] has been supportive of my decision and are supportive of any patients who have been under my care.  They will be happy to provide care to you and your family.  The office staff will do everything they can to ensure a seamless transition of care at [facility].  However if you are on any controlled substance medications (i.e. Pain medication or benzodiazepines), they will no longer be able to refill those medications, but we will be more than happy to refer you to a specialist.  [place] center will also assist you with the transfer of medical records should you wish to seek care elsewhere.  If you have any questions about your future care, you may call the office at [phone].  I have enjoyed getting to know my patients here and I wish you the very best.  Sincerely,  Rochel Brome, MD  ---------------------------------------  A written copy of this letter was given to the patient.  The patient verbalizes understanding of the letter, and does request referral to specialist or to new primary care provider.  This decision has been conveyed to Dr. Manuella Ghazi, who will place any appropriate referrals during today's visit.

## 2017-04-07 NOTE — Progress Notes (Signed)
Name: Mary Nielsen   MRN: [id number]    DOB: 02-04-1986   Date:04/07/2017       Progress Note  Subjective  Chief Complaint  Chief Complaint  Patient presents with  . Depression    3 month f/u  . medication mangement    questions about lunest     Depression       The patient presents with depression.  This is a recurrent problem.  The onset quality is gradual. The problem is unchanged.  Associated symptoms include decreased concentration, fatigue, insomnia and irritable.( mainly associated with lack of sleep. )  Compliance with treatment is good.  Past medical history includes anxiety and depression.   Insomnia  Primary symptoms: difficulty falling asleep.  The onset quality is gradual. The problem is unchanged. Past treatments include medication (Lunesta is not effective.). PMH includes: depression, family stress or anxiety.  Anxiety  Presents for follow-up visit. Symptoms include decreased concentration, excessive worry, insomnia, nervous/anxious behavior and panic. Patient reports no hyperventilation. The severity of symptoms is moderate and causing significant distress.   Her past medical history is significant for depression.      Past Medical History:  Diagnosis Date  . Anxiety   . Depression   . GERD (gastroesophageal reflux disease)     Past Surgical History:  Procedure Laterality Date  . MICRODISCECTOMY LUMBAR  2013   Dr. Tonita Cong    Family History  Problem Relation Age of Onset  . Cancer Father        Mouth Cancer  . Heart disease Maternal Grandmother   . Diabetes Maternal Grandmother   . Cancer Paternal Grandmother        Breast Cancer    Social History   Social History  . Marital status: Single    Spouse name: N/A  . Number of children: N/A  . Years of education: N/A   Occupational History  . Not on file.   Social History Main Topics  . Smoking status: Former Research scientist (life sciences)  . Smokeless tobacco: Never Used  . Alcohol use No  . Drug use: No  .  Sexual activity: Yes   Other Topics Concern  . Not on file   Social History Narrative  . No narrative on file     Current Outpatient Prescriptions:  .  clonazePAM (KLONOPIN) 1 MG tablet, Take 1 tablet (1 mg total) by mouth 2 (two) times daily as needed for anxiety., Disp: 60 tablet, Rfl: 2 .  eszopiclone (LUNESTA) 2 MG TABS tablet, Take 1 tablet (2 mg total) by mouth at bedtime as needed for sleep. Take immediately before bedtime, Disp: 30 tablet, Rfl: 2 .  Multiple Vitamins-Minerals (WOMENS ONE DAILY PO), Take by mouth., Disp: , Rfl:  .  venlafaxine XR (EFFEXOR-XR) 75 MG 24 hr capsule, Take 1 capsule (75 mg total) by mouth daily with breakfast., Disp: 90 capsule, Rfl: 0  Allergies  Allergen Reactions  . Codeine Itching     Review of Systems  Constitutional: Positive for fatigue.  Psychiatric/Behavioral: Positive for decreased concentration and depression. The patient is nervous/anxious and has insomnia.     Objective  Vitals:   04/07/17 1345 04/07/17 1348  BP: 126/78   Pulse: (!) 107 (!) 102  Resp: 16   Temp: 98.2 F (36.8 C)   TempSrc: Oral   SpO2: 94%   Weight: 246 lb (111.6 kg)   Height: 5\' 4"  (1.626 m)     Physical Exam  Constitutional: She is oriented to person, place,  and time and well-developed, well-nourished, and in no distress. She is irritable.  HENT:  Head: Normocephalic and atraumatic.  Cardiovascular: Normal rate, regular rhythm and normal heart sounds.   No murmur heard. Pulmonary/Chest: Effort normal and breath sounds normal. She has no wheezes.  Musculoskeletal: She exhibits no edema.  Neurological: She is alert and oriented to person, place, and time.  Psychiatric: Mood, memory, affect and judgment normal.  Nursing note and vitals reviewed.     Assessment & Plan  1. Major depression, recurrent, chronic (HCC) Stable and in partial remission, continue on venlafaxine Xr daily. - venlafaxine XR (EFFEXOR-XR) 75 MG 24 hr capsule; Take 1 capsule  (75 mg total) by mouth daily with breakfast.  Dispense: 90 capsule; Refill: 0  2. Panic disorder without agoraphobia  - clonazePAM (KLONOPIN) 1 MG tablet; Take 1 tablet (1 mg total) by mouth 2 (two) times daily as needed for anxiety.  Dispense: 60 tablet; Refill: 2  3. Insomnia secondary to anxiety D/C Lunesta start on trazodone for treatment. - traZODone (DESYREL) 50 MG tablet; Take 1 tablet (50 mg total) by mouth at bedtime as needed for sleep.  Dispense: 90 tablet; Refill: 0   Crystina Borrayo [facility] 04/07/2017 1:57 PM

## 2017-07-08 ENCOUNTER — Ambulatory Visit: Payer: 59 | Admitting: Family Medicine

## 2017-07-09 ENCOUNTER — Ambulatory Visit (INDEPENDENT_AMBULATORY_CARE_PROVIDER_SITE_OTHER): Payer: 59 | Admitting: Family Medicine

## 2017-07-09 ENCOUNTER — Encounter: Payer: Self-pay | Admitting: Family Medicine

## 2017-07-09 DIAGNOSIS — F339 Major depressive disorder, recurrent, unspecified: Secondary | ICD-10-CM

## 2017-07-09 DIAGNOSIS — F410 Panic disorder [episodic paroxysmal anxiety] without agoraphobia: Secondary | ICD-10-CM

## 2017-07-09 MED ORDER — VENLAFAXINE HCL ER 75 MG PO CP24: 75.0000 mg | ORAL_CAPSULE | Freq: Every day | ORAL | 1 refills | Status: AC

## 2017-07-09 MED ORDER — CLONAZEPAM 1 MG PO TABS: 1.0000 mg | ORAL_TABLET | Freq: Two times a day (BID) | ORAL | 2 refills | Status: AC | PRN

## 2017-07-09 NOTE — Progress Notes (Signed)
Name: Mary Nielsen   MRN: [id number]    DOB: Apr 10, 1986   Date:07/09/2017       Progress Note  Subjective  Chief Complaint  Chief Complaint  Patient presents with  . Depression  . Anxiety  . Medication Refill    Depression         This is a chronic problem.  The onset quality is gradual. The problem is unchanged.  Associated symptoms include insomnia.  Associated symptoms include no fatigue, no helplessness, no hopelessness, not irritable, no decreased interest and not sad.     The symptoms are aggravated by family issues (she broke up with her boyfriend.).  Past treatments include SNRIs - Serotonin and norepinephrine reuptake inhibitors.  Past medical history includes anxiety.   Anxiety  Presents for follow-up visit. Symptoms include depressed mood, excessive worry, insomnia, irritability, nervous/anxious behavior and panic. The severity of symptoms is moderate and causing significant distress.      Past Medical History:  Diagnosis Date  . Anxiety   . Depression   . GERD (gastroesophageal reflux disease)     Past Surgical History:  Procedure Laterality Date  . MICRODISCECTOMY LUMBAR  2013   Dr. Tonita Cong    Family History  Problem Relation Age of Onset  . Cancer Father        Mouth Cancer  . Heart disease Maternal Grandmother   . Diabetes Maternal Grandmother   . Cancer Paternal Grandmother        Breast Cancer  . Cervical cancer Mother        cells     Social History   Socioeconomic History  . Marital status: Single    Spouse name: Not on file  . Number of children: Not on file  . Years of education: Not on file  . Highest education level: Not on file  Social Needs  . Financial resource strain: Not on file  . Food insecurity - worry: Not on file  . Food insecurity - inability: Not on file  . Transportation needs - medical: Not on file  . Transportation needs - non-medical: Not on file  Occupational History  . Not on file  Tobacco Use  . Smoking status:  Former Research scientist (life sciences)  . Smokeless tobacco: Never Used  Substance and Sexual Activity  . Alcohol use: No  . Drug use: No  . Sexual activity: Yes  Other Topics Concern  . Not on file  Social History Narrative  . Not on file     Current Outpatient Medications:  .  clonazePAM (KLONOPIN) 1 MG tablet, Take 1 tablet (1 mg total) by mouth 2 (two) times daily as needed for anxiety., Disp: 60 tablet, Rfl: 2 .  Multiple Vitamins-Minerals (WOMENS ONE DAILY PO), Take by mouth., Disp: , Rfl:  .  venlafaxine XR (EFFEXOR-XR) 75 MG 24 hr capsule, Take 1 capsule (75 mg total) by mouth daily with breakfast., Disp: 90 capsule, Rfl: 0  Allergies  Allergen Reactions  . Codeine Itching  . Trazodone And Nefazodone Nausea And Vomiting    Headaches and dizziness      Review of Systems  Constitutional: Positive for irritability. Negative for fatigue.  Psychiatric/Behavioral: Positive for depression. The patient is nervous/anxious and has insomnia.      Objective  Vitals:   07/09/17 1350  BP: 124/76  Pulse: 85  Resp: 16  Temp: 98.4 F (36.9 C)  TempSrc: Oral  SpO2: 95%  Weight: 231 lb 14.4 oz (105.2 kg)    Physical Exam  Constitutional: She is oriented to person, place, and time and well-developed, well-nourished, and in no distress. She is not irritable.  Cardiovascular: Normal rate, regular rhythm and normal heart sounds.  No murmur heard. Pulmonary/Chest: Effort normal and breath sounds normal. She has no wheezes.  Neurological: She is alert and oriented to person, place, and time.  Psychiatric: Mood, memory, affect and judgment normal.  Nursing note and vitals reviewed.     Assessment & Plan  1. Major depression, recurrent, chronic (HCC) Stable overall, continue on Effexor - venlafaxine XR (EFFEXOR-XR) 75 MG 24 hr capsule; Take 1 capsule (75 mg total) by mouth daily with breakfast.  Dispense: 90 capsule; Refill: 1  2. Panic disorder without agoraphobia Marginally worse, she is  dealing with the breakup from her boyfriend, continue on clonazepam as needed, informed that she must seek consultation with a new PCP or psychiatrist in the next 3 months to be able to continue taking clonazepam - clonazePAM (KLONOPIN) 1 MG tablet; Take 1 tablet (1 mg total) by mouth 2 (two) times daily as needed for anxiety.  Dispense: 60 tablet; Refill: 2   Mary Nielsen [facility] 07/09/2017 2:05 PM

## 2017-10-09 ENCOUNTER — Encounter: Payer: Self-pay | Admitting: Emergency Medicine

## 2017-10-09 ENCOUNTER — Emergency Department: Payer: Self-pay

## 2017-10-09 ENCOUNTER — Emergency Department
Admission: EM | Admit: 2017-10-09 | Discharge: 2017-10-09 | Disposition: A | Discharge status: Home / Self Care | Payer: Self-pay | Attending: Emergency Medicine | Admitting: Emergency Medicine

## 2017-10-09 DIAGNOSIS — M545 Low back pain, unspecified: Secondary | ICD-10-CM

## 2017-10-09 DIAGNOSIS — Z87891 Personal history of nicotine dependence: Secondary | ICD-10-CM | POA: Insufficient documentation

## 2017-10-09 DIAGNOSIS — M5442 Lumbago with sciatica, left side: Secondary | ICD-10-CM | POA: Insufficient documentation

## 2017-10-09 DIAGNOSIS — M5432 Sciatica, left side: Secondary | ICD-10-CM

## 2017-10-09 DIAGNOSIS — Z79899 Other long term (current) drug therapy: Secondary | ICD-10-CM | POA: Insufficient documentation

## 2017-10-09 LAB — POCT PREGNANCY, URINE: PREG TEST UR: NEGATIVE

## 2017-10-09 MED ORDER — HYDROCODONE-ACETAMINOPHEN 5-325 MG PO TABS: 1.0000 | ORAL_TABLET | ORAL | 0 refills | Status: AC | PRN

## 2017-10-09 MED ORDER — CYCLOBENZAPRINE HCL 10 MG PO TABS: 10.0000 mg | ORAL_TABLET | Freq: Three times a day (TID) | ORAL | 0 refills | Status: DC | PRN

## 2017-10-09 MED ORDER — OXYCODONE HCL 5 MG PO TABS
5.0000 mg | ORAL_TABLET | Freq: Once | ORAL | Status: AC
  Administered 2017-10-09: 5 mg via ORAL
  Filled 2017-10-09: qty 1

## 2017-10-09 MED ORDER — MELOXICAM 15 MG PO TABS: 15.0000 mg | ORAL_TABLET | Freq: Every day | ORAL | 0 refills | Status: AC

## 2017-10-09 MED ORDER — PREDNISONE 10 MG (21) PO TBPK: ORAL_TABLET | ORAL | 0 refills | Status: DC

## 2017-10-09 MED ORDER — CYCLOBENZAPRINE HCL 10 MG PO TABS
10.0000 mg | ORAL_TABLET | Freq: Once | ORAL | Status: AC
  Administered 2017-10-09: 10 mg via ORAL
  Filled 2017-10-09: qty 1

## 2017-10-09 NOTE — ED Provider Notes (Signed)
[facility] Emergency Department Provider Note ____________________________________________  Time seen: Approximately 6:33 PM  I have reviewed the triage vital signs and the nursing notes.   HISTORY  Chief Complaint Back Pain    HPI Mary Nielsen is a 32 y.o. female who presents to the emergency department for evaluation and treatment of left hip pain that radiates into her left leg into foot. She had a microdiscectomy a few years ago and hasn't had any issues since. Four days ago, she developed pain that has not improved with tylenol. She denies injury.  Past Medical History:  Diagnosis Date  . Anxiety   . Depression   . GERD (gastroesophageal reflux disease)     Patient Active Problem List   Diagnosis Date Noted  . Sore throat 11/19/2015  . Acute streptococcal pharyngitis 11/19/2015  . Major depression in partial remission ([place]) 05/14/2015  . Anxiety disorder 09/14/2014  . CA cervix ([place]) 09/14/2014  . Insomnia, persistent 09/14/2014  . Acid reflux 09/14/2014  . Insomnia secondary to anxiety 09/14/2014  . Low back pain with sciatica 09/14/2014  . Depression, major, in remission ([place]) 09/14/2014  . Panic 09/14/2014  . Screening for depression 09/14/2014  . Elevated platelet count 09/14/2014    Past Surgical History:  Procedure Laterality Date  . MICRODISCECTOMY LUMBAR  2013   Dr. Tonita Cong    Prior to Admission medications   Medication Sig Start Date End Date Taking? Authorizing Provider  clonazePAM (KLONOPIN) 1 MG tablet Take 1 tablet (1 mg total) by mouth 2 (two) times daily as needed for anxiety. 07/09/17   Roselee Nova, MD  cyclobenzaprine (FLEXERIL) 10 MG tablet Take 1 tablet (10 mg total) by mouth 3 (three) times daily as needed for muscle spasms. 10/09/17   Kirsten Spearing, Johnette Abraham B, FNP  HYDROcodone-acetaminophen (NORCO/VICODIN) 5-325 MG tablet Take 1 tablet by mouth every 4 (four) hours as needed for moderate pain. 10/09/17 10/09/18  Kailani Brass,  Johnette Abraham B, FNP  meloxicam (MOBIC) 15 MG tablet Take 1 tablet (15 mg total) by mouth daily. Start after finishing prednisone. 10/09/17   Sergio Hobart, Dessa Phi, FNP  Multiple Vitamins-Minerals (WOMENS ONE DAILY PO) Take by mouth.    [provider]  predniSONE (STERAPRED UNI-PAK 21 TAB) 10 MG (21) TBPK tablet Take 6 tablets on day 1 Take 5 tablets on day 2 Take 4 tablets on day 3 Take 3 tablets on day 4 Take 2 tablets on day 5 Take 1 tablet on day 6 10/09/17   Sherrie George B, FNP  venlafaxine XR (EFFEXOR-XR) 75 MG 24 hr capsule Take 1 capsule (75 mg total) by mouth daily with breakfast. 07/09/17   Roselee Nova, MD    Allergies Codeine and Trazodone and nefazodone  Family History  Problem Relation Age of Onset  . Cancer Father        Mouth Cancer  . Heart disease Maternal Grandmother   . Diabetes Maternal Grandmother   . Cancer Paternal Grandmother        Breast Cancer  . Cervical cancer Mother        cells     Social History Social History   Tobacco Use  . Smoking status: Former Research scientist (life sciences)  . Smokeless tobacco: Never Used  Substance Use Topics  . Alcohol use: No  . Drug use: No    Review of Systems Constitutional: Negative for fever. Cardiovascular: Negative for chest pain. Respiratory: Negative for shortness of breath. Musculoskeletal: Positive for left hip pain.  Skin:  Intact.  Neurological: Negative for decrease in sensation. Positive for paresthesia posterior left leg.  ____________________________________________   PHYSICAL EXAM:  VITAL SIGNS: ED Triage Vitals  Enc Vitals Group     BP 10/09/17 1729 119/78     Pulse Rate 10/09/17 1729 96     Resp 10/09/17 1729 18     Temp 10/09/17 1729 98.7 F (37.1 C)     Temp Source 10/09/17 1729 Oral     SpO2 10/09/17 1729 98 %     Weight 10/09/17 1729 208 lb (94.3 kg)     Height 10/09/17 1729 5\' 3"  (1.6 m)     Head Circumference --      Peak Flow --      Pain Score 10/09/17 1733 10     Pain Loc --      Pain Edu?  --      Excl. in [place]? --     Constitutional: Alert and oriented. Well appearing and in no acute distress. Eyes: Conjunctivae are clear without discharge or drainage Head: Atraumatic Neck: Atraumatic Respiratory: No cough. Respirations are even and unlabored. Musculoskeletal: Negative straight leg raise bilaterally.  Neurologic: Motor and sensory intact  Skin: Intact  Psychiatric: Affect and behavior are appropriate.  ____________________________________________   LABS (all labs ordered are listed, but only abnormal results are displayed)  Labs Reviewed  POC URINE PREG, ED  POCT PREGNANCY, URINE   ____________________________________________  RADIOLOGY  Image of the lumbar spine is negative for acute abnormality per radiology. ____________________________________________   PROCEDURES  Procedures  ____________________________________________   INITIAL IMPRESSION / ASSESSMENT AND PLAN / ED COURSE  Mary Nielsen is a 32 y.o. who presents to the emergency department for treatment and evaluation of left hip pain that radiates  Patient instructed to follow-up with orthopedics the posterior left lower extremity.  Patient states that she has experienced this pain in the past and was diagnosed with sciatica.  While here in the department, she was given Flexeril and oxycodone IR with some decrease in pain.  She would be discharged home with a prescription for prednisone, Flexeril, and Norco.  Differential diagnosis includes but not limited to sciatica.  She was given follow-up information for neurology for symptoms that are not improving over the week.Marland Kitchen  She was also instructed to return to the emergency department for symptoms that change or worsen if unable schedule an appointment with orthopedics or primary care.  Medications  cyclobenzaprine (FLEXERIL) tablet 10 mg (10 mg Oral Given 10/09/17 1848)  oxyCODONE (Oxy IR/ROXICODONE) immediate release tablet 5 mg (5 mg Oral Given  10/09/17 1848)    Pertinent labs & imaging results that were available during my care of the patient were reviewed by me and considered in my medical decision making (see chart for details).  _________________________________________   FINAL CLINICAL IMPRESSION(S) / ED DIAGNOSES  Final diagnoses:  Acute lumbar back pain  Sciatica of left side    ED Discharge Orders        Ordered    predniSONE (STERAPRED UNI-PAK 21 TAB) 10 MG (21) TBPK tablet     10/09/17 2029    cyclobenzaprine (FLEXERIL) 10 MG tablet  3 times daily PRN     10/09/17 2029    HYDROcodone-acetaminophen (NORCO/VICODIN) 5-325 MG tablet  Every 4 hours PRN     10/09/17 2029    meloxicam (MOBIC) 15 MG tablet  Daily     10/09/17 2031       If controlled substance prescribed during this  visit, 12 month history viewed on the [place] prior to issuing an initial prescription for Schedule II or III opiod.    Victorino Dike, FNP 10/09/17 2215    Earleen Newport, MD 10/09/17 2244

## 2017-10-09 NOTE — ED Notes (Signed)
Patient given another ginger ale.

## 2017-10-09 NOTE — ED Triage Notes (Signed)
Patient presents to the ED with lower back pain radiating down left leg into foot.  Patient states, "I already know what it is, it's sciatic nerve pain."  Patient states she has had issues with back pain since 2011 and had back surgery in 2012.  Patient reports she has had increased back pain x 4 days but states this morning pain became unbearable.  Denies recent trauma.

## 2017-10-09 NOTE — ED Notes (Signed)
Patient transported to X-ray 

## 2017-10-19 ENCOUNTER — Other Ambulatory Visit: Payer: Self-pay

## 2017-10-19 ENCOUNTER — Encounter (HOSPITAL_COMMUNITY): Payer: Self-pay | Admitting: Emergency Medicine

## 2017-10-19 ENCOUNTER — Emergency Department (HOSPITAL_COMMUNITY)
Admission: EM | Admit: 2017-10-19 | Discharge: 2017-10-19 | Disposition: A | Discharge status: Home / Self Care | Payer: Self-pay | Attending: Emergency Medicine | Admitting: Emergency Medicine

## 2017-10-19 DIAGNOSIS — N390 Urinary tract infection, site not specified: Secondary | ICD-10-CM | POA: Insufficient documentation

## 2017-10-19 DIAGNOSIS — R319 Hematuria, unspecified: Secondary | ICD-10-CM | POA: Insufficient documentation

## 2017-10-19 DIAGNOSIS — M5432 Sciatica, left side: Secondary | ICD-10-CM | POA: Insufficient documentation

## 2017-10-19 LAB — URINALYSIS, ROUTINE W REFLEX MICROSCOPIC
BILIRUBIN URINE: NEGATIVE
Glucose, UA: NEGATIVE mg/dL
Ketones, ur: NEGATIVE mg/dL
Nitrite: NEGATIVE
PH: 5 (ref 5.0–8.0)
Protein, ur: NEGATIVE mg/dL
SPECIFIC GRAVITY, URINE: 1.019 (ref 1.005–1.030)

## 2017-10-19 MED ORDER — HYDROMORPHONE HCL 1 MG/ML IJ SOLN
1.0000 mg | Freq: Once | INTRAMUSCULAR | Status: AC
  Administered 2017-10-19: 1 mg via INTRAMUSCULAR
  Filled 2017-10-19: qty 1

## 2017-10-19 MED ORDER — CEPHALEXIN 500 MG PO CAPS: 500.0000 mg | ORAL_CAPSULE | Freq: Four times a day (QID) | ORAL | 0 refills | Status: AC

## 2017-10-19 MED ORDER — PREDNISONE 50 MG PO TABS: 50.0000 mg | ORAL_TABLET | Freq: Every day | ORAL | 0 refills | Status: AC

## 2017-10-19 MED ORDER — KETOROLAC TROMETHAMINE 30 MG/ML IJ SOLN
30.0000 mg | Freq: Once | INTRAMUSCULAR | Status: AC
  Administered 2017-10-19: 30 mg via INTRAMUSCULAR
  Filled 2017-10-19: qty 1

## 2017-10-19 MED ORDER — CYCLOBENZAPRINE HCL 10 MG PO TABS: 10.0000 mg | ORAL_TABLET | Freq: Two times a day (BID) | ORAL | 0 refills | Status: AC | PRN

## 2017-10-19 MED ORDER — METHYLPREDNISOLONE SODIUM SUCC 125 MG IJ SOLR
125.0000 mg | Freq: Once | INTRAMUSCULAR | Status: AC
  Administered 2017-10-19: 125 mg via INTRAMUSCULAR
  Filled 2017-10-19: qty 2

## 2017-10-19 NOTE — ED Triage Notes (Signed)
Patient brought in by EMS with complaint of lower back pain radiating down left leg x 2 weeks. States she was treated at [facility] for same.

## 2017-10-19 NOTE — Discharge Instructions (Signed)
Follow up with Dr. Aline Brochure or Dr. Berneice Gandy your choice for recheck if pain persist

## 2017-10-20 NOTE — ED Provider Notes (Signed)
[facility] EMERGENCY DEPARTMENT Provider Note   CSN: [id number] Arrival date & time: 10/19/17  1625     History   Chief Complaint Chief Complaint  Patient presents with  . Back Pain    HPI Mary Nielsen is a 32 y.o. female.  The history is provided by the patient. No language interpreter was used.  Back Pain   This is a new problem. The problem occurs constantly. The problem has been gradually worsening. The pain is associated with no known injury. The patient is experiencing no pain. Pertinent negatives include no chest pain, no abdominal pain, no bladder incontinence and no dysuria. She has tried nothing for the symptoms. The treatment provided no relief.   Pt reports she has had sciatica in the past.  Pt reports she had an exacerbation a few days ago and went to [facility] ED.  Pt reports she was better after taking prednisone but symptoms have returned.   Past Medical History:  Diagnosis Date  . Anxiety   . Depression   . GERD (gastroesophageal reflux disease)     Patient Active Problem List   Diagnosis Date Noted  . Sore throat 11/19/2015  . Acute streptococcal pharyngitis 11/19/2015  . Major depression in partial remission ([place]) 05/14/2015  . Anxiety disorder 09/14/2014  . CA cervix ([place]) 09/14/2014  . Insomnia, persistent 09/14/2014  . Acid reflux 09/14/2014  . Insomnia secondary to anxiety 09/14/2014  . Low back pain with sciatica 09/14/2014  . Depression, major, in remission ([place]) 09/14/2014  . Panic 09/14/2014  . Screening for depression 09/14/2014  . Elevated platelet count 09/14/2014    Past Surgical History:  Procedure Laterality Date  . MICRODISCECTOMY LUMBAR  2013   Dr. Tonita Cong     OB History   None      Home Medications    Prior to Admission medications   Medication Sig Start Date End Date Taking? Authorizing Provider  cephALEXin (KEFLEX) 500 MG capsule Take 1 capsule (500 mg total) by mouth 4 (four) times daily. 10/19/17   Fransico Meadow,  PA-C  clonazePAM (KLONOPIN) 1 MG tablet Take 1 tablet (1 mg total) by mouth 2 (two) times daily as needed for anxiety. 07/09/17   Roselee Nova, MD  cyclobenzaprine (FLEXERIL) 10 MG tablet Take 1 tablet (10 mg total) by mouth 2 (two) times daily as needed for muscle spasms. 10/19/17   Fransico Meadow, PA-C  HYDROcodone-acetaminophen (NORCO/VICODIN) 5-325 MG tablet Take 1 tablet by mouth every 4 (four) hours as needed for moderate pain. 10/09/17 10/09/18  Triplett, Johnette Abraham B, FNP  meloxicam (MOBIC) 15 MG tablet Take 1 tablet (15 mg total) by mouth daily. Start after finishing prednisone. 10/09/17   Triplett, Dessa Phi, FNP  Multiple Vitamins-Minerals (WOMENS ONE DAILY PO) Take by mouth.    [provider]  predniSONE (DELTASONE) 50 MG tablet Take 1 tablet (50 mg total) by mouth daily. 10/19/17   Fransico Meadow, PA-C  venlafaxine XR (EFFEXOR-XR) 75 MG 24 hr capsule Take 1 capsule (75 mg total) by mouth daily with breakfast. 07/09/17   Roselee Nova, MD    Family History Family History  Problem Relation Age of Onset  . Cancer Father        Mouth Cancer  . Heart disease Maternal Grandmother   . Diabetes Maternal Grandmother   . Cancer Paternal Grandmother        Breast Cancer  . Cervical cancer Mother  cells     Social History Social History   Tobacco Use  . Smoking status: Former Research scientist (life sciences)  . Smokeless tobacco: Never Used  Substance Use Topics  . Alcohol use: No  . Drug use: No     Allergies   Codeine and Trazodone and nefazodone   Review of Systems Review of Systems  Cardiovascular: Negative for chest pain.  Gastrointestinal: Negative for abdominal pain.  Genitourinary: Negative for bladder incontinence and dysuria.  Musculoskeletal: Positive for back pain.  All other systems reviewed and are negative.    Physical Exam Updated Vital Signs BP 123/80 (BP Location: Right Arm)   Pulse 94   Temp 98.2 F (36.8 C) (Oral)   Resp 18   Wt 103.4 kg (228 lb)   LMP  09/28/2017   SpO2 98%   BMI 39.44 kg/m   Physical Exam  Constitutional: She appears well-developed and well-nourished.  HENT:  Head: Normocephalic.  Eyes: Pupils are equal, round, and reactive to light.  Cardiovascular: Normal rate.  Pulmonary/Chest: Effort normal.  Abdominal: Soft.  Musculoskeletal: She exhibits no deformity.  Neurological: She is alert.  Skin: Skin is warm.  Psychiatric: She has a normal mood and affect.  Nursing note and vitals reviewed.    ED Treatments / Results  Labs (all labs ordered are listed, but only abnormal results are displayed) Labs Reviewed  URINALYSIS, ROUTINE W REFLEX MICROSCOPIC - Abnormal; Notable for the following components:      Result Value   APPearance HAZY (*)    Hgb urine dipstick MODERATE (*)    Leukocytes, UA TRACE (*)    Bacteria, UA MANY (*)    All other components within normal limits    EKG None  Radiology No results found.  Procedures Procedures (including critical care time)  Medications Ordered in ED Medications  ketorolac (TORADOL) 30 MG/ML injection 30 mg (30 mg Intramuscular Given 10/19/17 1813)  HYDROmorphone (DILAUDID) injection 1 mg (1 mg Intramuscular Given 10/19/17 1813)  methylPREDNISolone sodium succinate (SOLU-MEDROL) 125 mg/2 mL injection 125 mg (125 mg Intramuscular Given 10/19/17 1813)     Initial Impression / Assessment and Plan / ED Course  I have reviewed the triage vital signs and the nursing notes.  Pertinent labs & imaging results that were available during my care of the patient were reviewed by me and considered in my medical decision making (see chart for details).     MDM pt reports she feels better after injection.  Pt counseled on uti.    Final Clinical Impressions(s) / ED Diagnoses   Final diagnoses:  Sciatica of left side  Urinary tract infection with hematuria, site unspecified    ED Discharge Orders        Ordered    cephALEXin (KEFLEX) 500 MG capsule  4 times daily       10/19/17 1938    predniSONE (DELTASONE) 50 MG tablet  Daily     10/19/17 1938    cyclobenzaprine (FLEXERIL) 10 MG tablet  2 times daily PRN     10/19/17 1938    An After Visit Summary was printed and given to the patient.    Fransico Meadow, PA-C 10/20/17 Sande Rives    Orlie Dakin, MD 10/20/17 [phone]

## 2017-11-23 ENCOUNTER — Telehealth: Payer: Self-pay

## 2017-11-23 NOTE — Telephone Encounter (Signed)
Pt notified and given phone numbers

## 2017-11-23 NOTE — Telephone Encounter (Signed)
Copied from [place] [id number]. Topic: Inquiry >> Nov 23, 2017  8:29 AM Scherrie Gerlach wrote: Reason for CRM: pt used to see Dr Manuella Ghazi. pt would like to know if Benjamine Mola will prescribe venlafaxine XR (EFFEXOR-XR) 75 MG 24 hr capsule and  clonazePAM (KLONOPIN) 1 MG tablet  Pt would like to establish with Dr Sanda Klein eventually, will she prescribe these as well?  Pt is self pay and wanted to know before she scheduled Pt advised someone will call har back to let her know if ok to schedule. Pt is due now for her refills  >> Nov 23, 2017 10:06 AM Karene Fry P wrote: Damaris Schooner with patient and she is not able to come in today to see dr lada. She was informed that we can refill the Effexor but not the clonazepam. Pt is requesting return call would like to know what she can do in order to not get having withdrawals from the clonazepam. She has 7 pills left. Please return call to discuss. [phone]

## 2017-12-08 ENCOUNTER — Ambulatory Visit: Payer: Self-pay | Admitting: Family Medicine

## 2018-12-17 IMAGING — CR DG LUMBAR SPINE 2-3V
1 series · 3 of 3 positions shown · non-contrast
Comparison: Intraoperative lumbar spine radiographs 07/17/2010.
Lumbar spine radiographs 07/12/2010.

CLINICAL DATA: Low back pain extending into the left lower
extremity. Chronic low back pain. Increased pain over last 4 days
which became unbearable today.

EXAM:
LUMBAR SPINE - 2-3 VIEW

[Series 1: dg lumbar spine 2-3 views · 0.14mm/px · 3 of 3 slices shown]
[im 1/3]
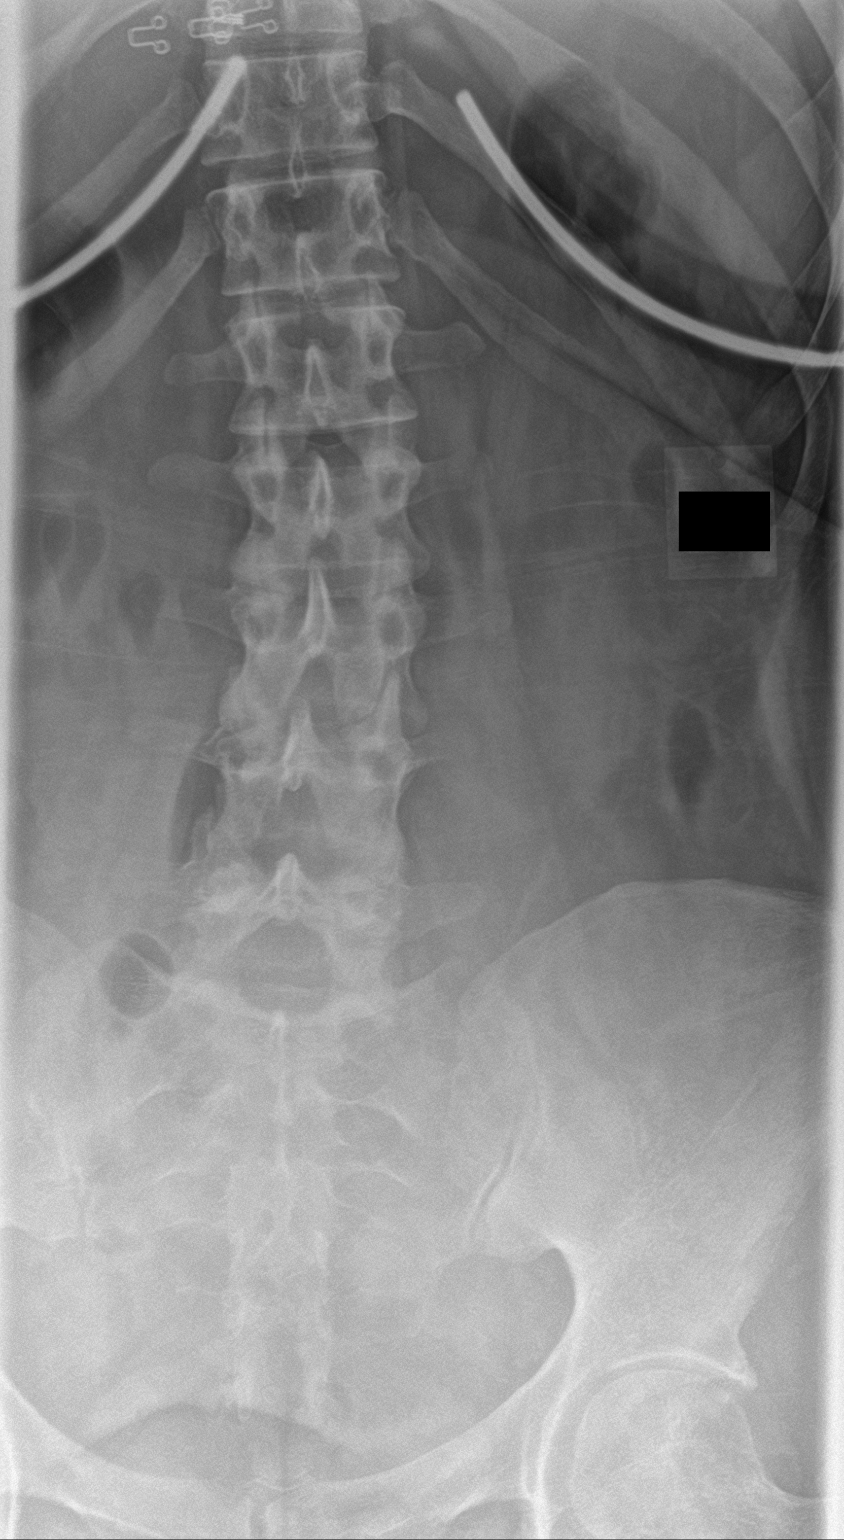
[im 2/3]
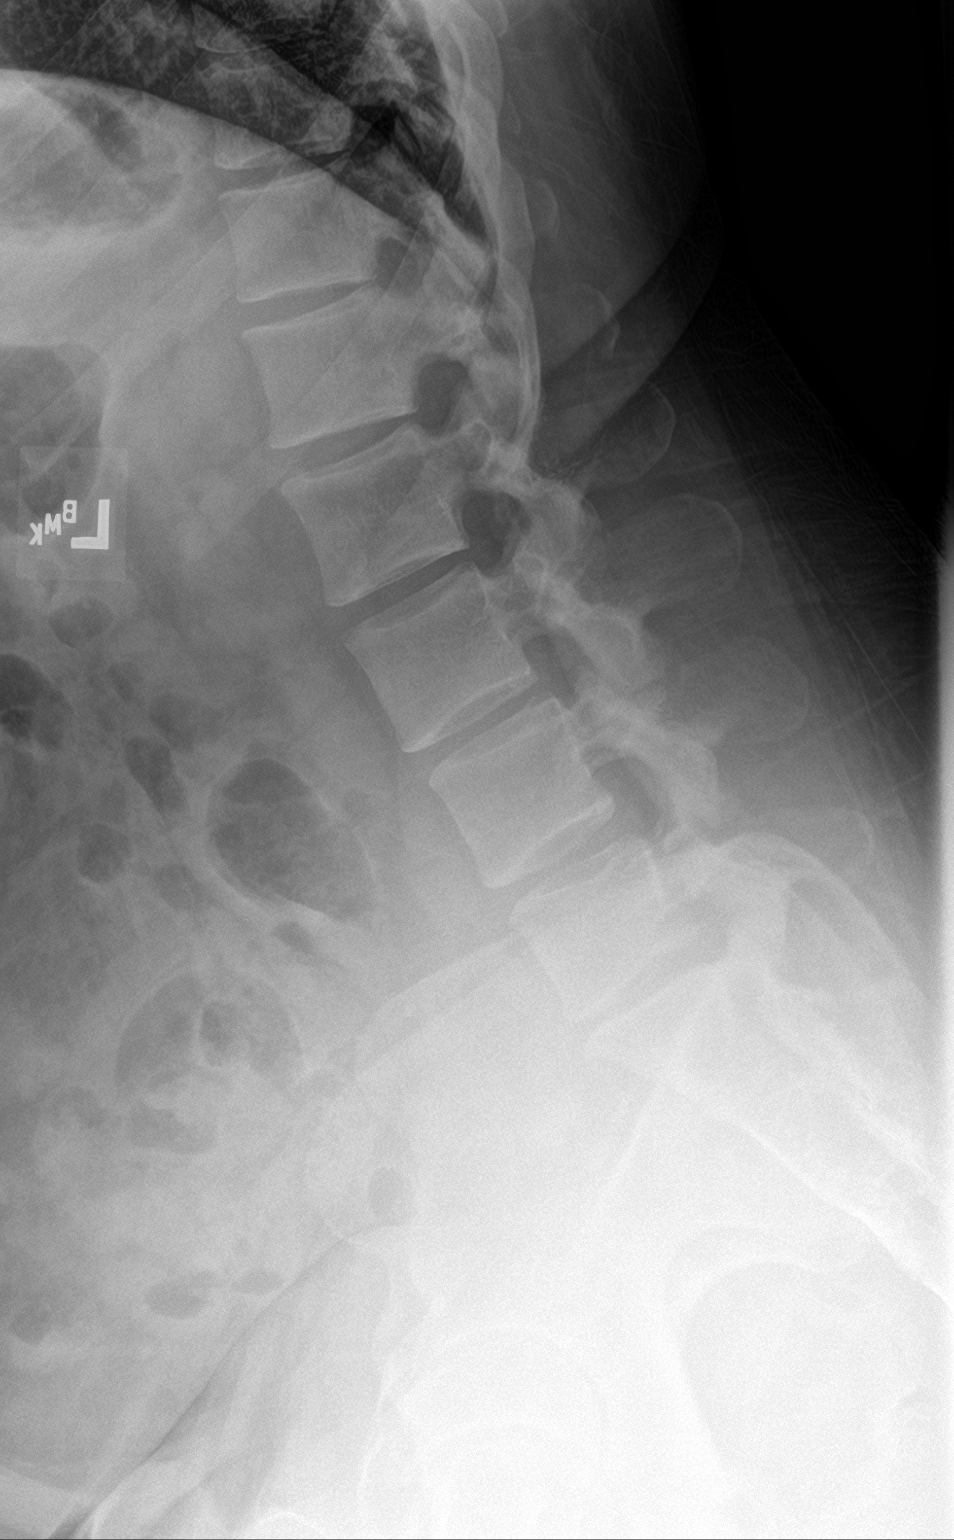
[im 3/3]
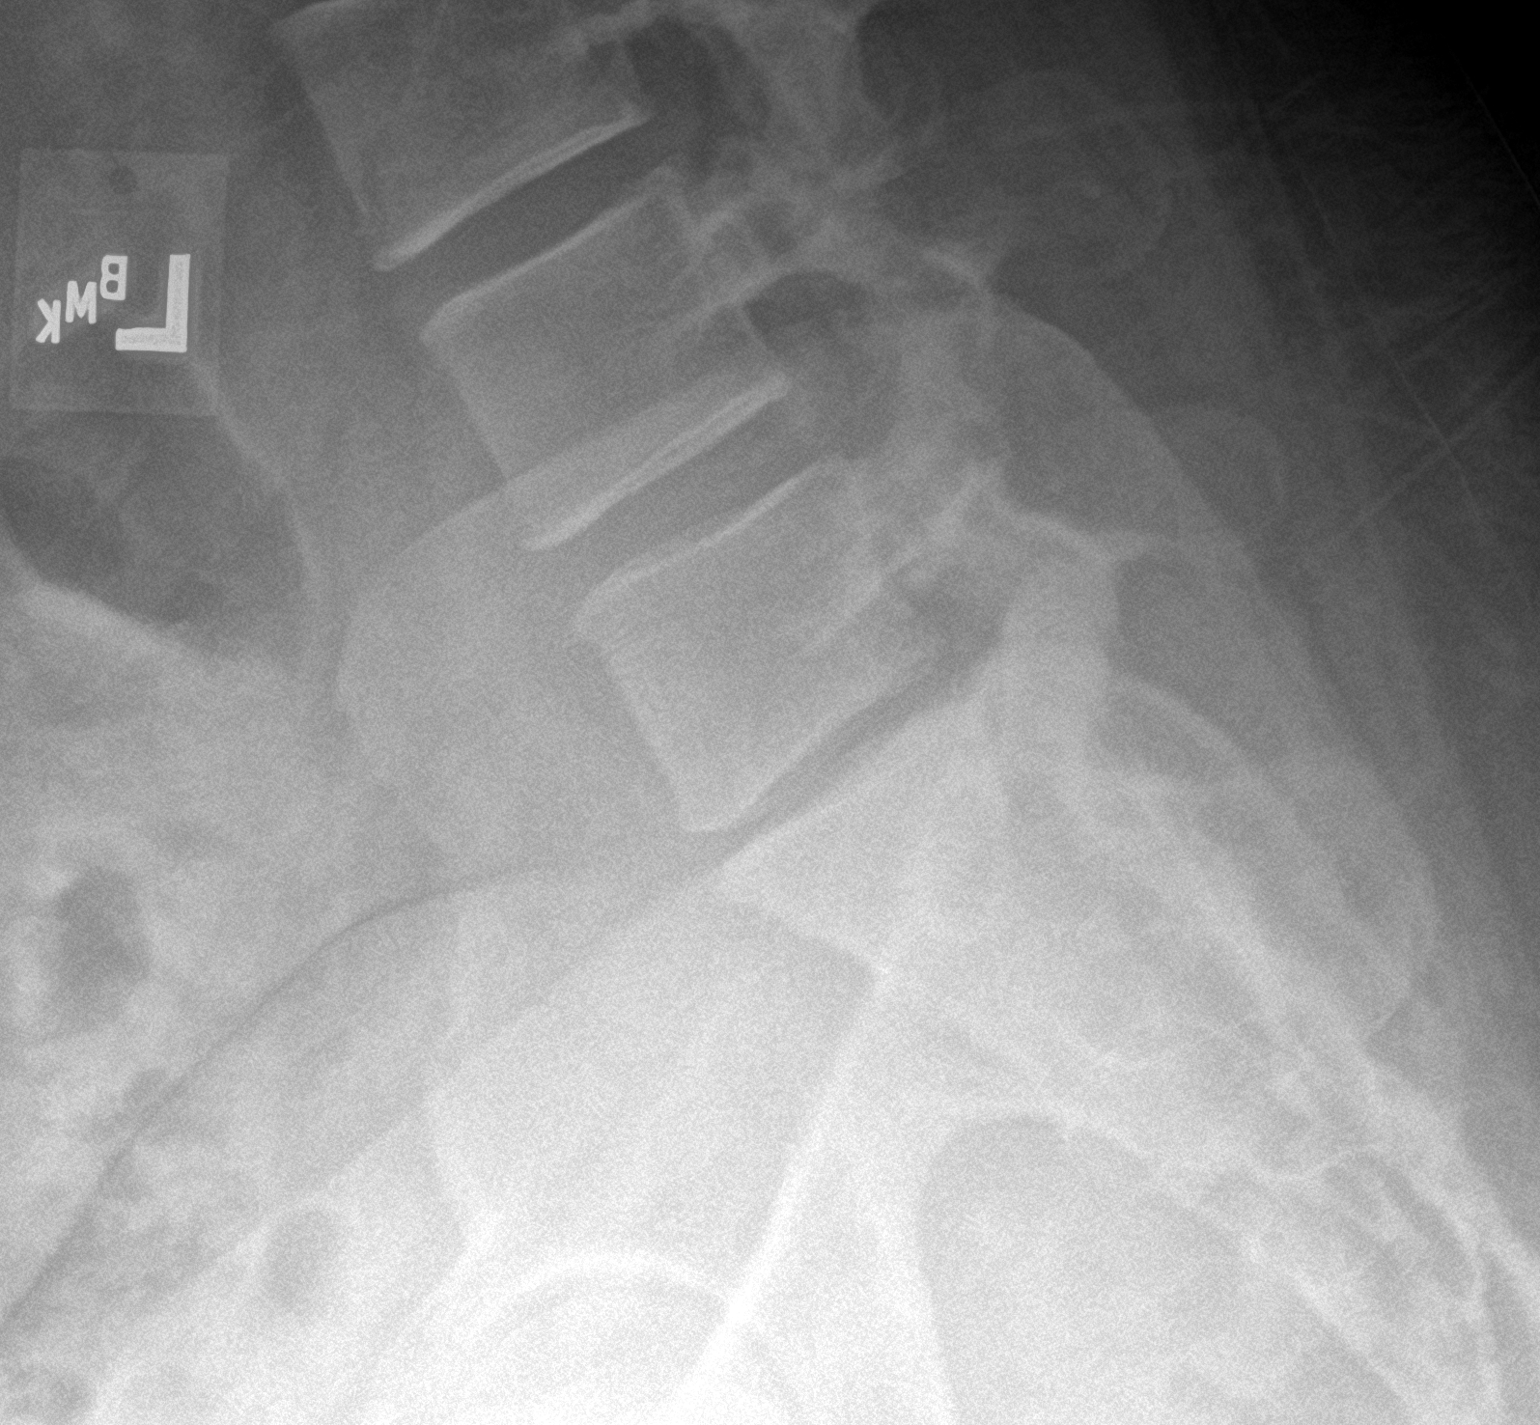

[3 of 3 positions shown; findings below may reference images not displayed]

FINDINGS: Five non rib-bearing lumbar type vertebral bodies are present.
Straightening of the lumbar lordosis is chronic. Mild leftward
curvature of the lumbar spine is stable. No acute fracture traumatic
subluxation is present. Bowel gas pattern is normal.
IMPRESSION: 1. No acute abnormality or significant interval change of the lumbar
spine.

## 2021-10-21 ENCOUNTER — Ambulatory Visit: Payer: 59 | Admitting: Psychiatry
